# Patient Record
Sex: Female | Born: 1960 | Race: White | Hispanic: No | Marital: Married
Health system: Southern US, Community
[De-identification: ages and names within clinical notes are randomized; demographics above are authoritative.]

---

## 1999-06-06 ENCOUNTER — Other Ambulatory Visit: Admission: RE | Admit: 1999-06-06 | Discharge: 1999-06-06 | Payer: Self-pay | Admitting: Family Medicine

## 2000-10-22 ENCOUNTER — Other Ambulatory Visit: Admission: RE | Admit: 2000-10-22 | Discharge: 2000-10-22 | Payer: Self-pay | Admitting: Family Medicine

## 2001-11-20 ENCOUNTER — Other Ambulatory Visit: Admission: RE | Admit: 2001-11-20 | Discharge: 2001-11-20 | Payer: Self-pay | Admitting: Family Medicine

## 2003-02-02 ENCOUNTER — Other Ambulatory Visit: Admission: RE | Admit: 2003-02-02 | Discharge: 2003-02-02 | Payer: Self-pay | Admitting: Family Medicine

## 2003-08-31 ENCOUNTER — Other Ambulatory Visit: Admission: RE | Admit: 2003-08-31 | Discharge: 2003-08-31 | Payer: Self-pay | Admitting: Family Medicine

## 2004-04-04 ENCOUNTER — Other Ambulatory Visit: Admission: RE | Admit: 2004-04-04 | Discharge: 2004-04-04 | Payer: Self-pay | Admitting: Family Medicine

## 2004-09-12 ENCOUNTER — Ambulatory Visit: Payer: Self-pay | Admitting: Family Medicine

## 2004-09-15 ENCOUNTER — Other Ambulatory Visit: Admission: RE | Admit: 2004-09-15 | Discharge: 2004-09-15 | Payer: Self-pay | Admitting: Family Medicine

## 2004-09-15 ENCOUNTER — Ambulatory Visit: Payer: Self-pay | Admitting: Family Medicine

## 2004-12-06 ENCOUNTER — Ambulatory Visit: Payer: Self-pay | Admitting: Family Medicine

## 2005-01-03 ENCOUNTER — Ambulatory Visit: Payer: Self-pay | Admitting: Family Medicine

## 2005-02-05 ENCOUNTER — Ambulatory Visit: Payer: Self-pay | Admitting: Family Medicine

## 2005-03-14 ENCOUNTER — Ambulatory Visit: Payer: Self-pay | Admitting: Family Medicine

## 2005-04-12 ENCOUNTER — Ambulatory Visit: Payer: Self-pay | Admitting: Family Medicine

## 2005-06-13 ENCOUNTER — Ambulatory Visit: Payer: Self-pay | Admitting: Family Medicine

## 2005-06-21 ENCOUNTER — Ambulatory Visit: Payer: Self-pay | Admitting: Family Medicine

## 2005-11-21 ENCOUNTER — Ambulatory Visit: Payer: Self-pay | Admitting: Family Medicine

## 2005-11-21 ENCOUNTER — Other Ambulatory Visit: Admission: RE | Admit: 2005-11-21 | Discharge: 2005-11-21 | Payer: Self-pay | Admitting: Family Medicine

## 2005-11-26 ENCOUNTER — Ambulatory Visit: Payer: Self-pay | Admitting: Family Medicine

## 2005-12-27 ENCOUNTER — Ambulatory Visit: Payer: Self-pay | Admitting: Family Medicine

## 2007-06-30 ENCOUNTER — Ambulatory Visit: Payer: Self-pay | Admitting: Vascular Surgery

## 2010-03-27 ENCOUNTER — Ambulatory Visit (HOSPITAL_COMMUNITY): Admission: RE | Admit: 2010-03-27 | Discharge: 2010-03-27 | Payer: Self-pay | Admitting: Specialist

## 2010-04-13 ENCOUNTER — Ambulatory Visit (HOSPITAL_COMMUNITY): Admission: RE | Admit: 2010-04-13 | Discharge: 2010-04-14 | Payer: Self-pay | Admitting: Specialist

## 2011-01-22 LAB — CBC
HCT: 39.3 % (ref 36.0–46.0)
Hemoglobin: 13.2 g/dL (ref 12.0–15.0)
MCHC: 33.7 g/dL (ref 30.0–36.0)
MCV: 88.5 fL (ref 78.0–100.0)
Platelets: 210 10*3/uL (ref 150–400)
RBC: 4.44 MIL/uL (ref 3.87–5.11)
RDW: 14.3 % (ref 11.5–15.5)
WBC: 6.3 10*3/uL (ref 4.0–10.5)

## 2011-01-22 LAB — COMPREHENSIVE METABOLIC PANEL
ALT: 40 U/L — ABNORMAL HIGH (ref 0–35)
AST: 30 U/L (ref 0–37)
Albumin: 4.3 g/dL (ref 3.5–5.2)
Alkaline Phosphatase: 63 U/L (ref 39–117)
BUN: 9 mg/dL (ref 6–23)
CO2: 28 mEq/L (ref 19–32)
Calcium: 10.5 mg/dL (ref 8.4–10.5)
Chloride: 103 mEq/L (ref 96–112)
Creatinine, Ser: 0.81 mg/dL (ref 0.4–1.2)
GFR calc Af Amer: >60 mL/min (ref >60)
GFR calc non Af Amer: >60 mL/min (ref >60)
Glucose, Bld: 94 mg/dL (ref 70–99)
Potassium: 3.7 mEq/L (ref 3.5–5.1)
Sodium: 137 mEq/L (ref 135–145)
Total Bilirubin: 1.2 mg/dL (ref 0.3–1.2)
Total Protein: 7.5 g/dL (ref 6.0–8.3)

## 2011-01-22 LAB — SURGICAL PCR SCREEN: MRSA, PCR: NEGATIVE

## 2011-01-22 LAB — URINALYSIS, ROUTINE W REFLEX MICROSCOPIC
Glucose, UA: NEGATIVE mg/dL
Hgb urine dipstick: NEGATIVE
Nitrite: NEGATIVE
Specific Gravity, Urine: 1.007 (ref 1.005–1.030)

## 2011-01-22 LAB — PROTIME-INR: Prothrombin Time: 13.2 seconds (ref 11.6–15.2)

## 2011-01-22 LAB — APTT: aPTT: 32 seconds (ref 24–37)

## 2011-03-20 NOTE — Procedures (Signed)
LOWER EXTREMITY VENOUS REFLUX EXAM   INDICATION:  Bilateral lower extremity varicose veins.   EXAM:  Using color-flow imaging and pulse Doppler spectral analysis, the  right and left common femoral, superficial femoral, popliteal, posterior  tibial, greater and lesser saphenous veins are evaluated.  There is no  evidence suggesting deep venous insufficiency in the right and left  lower extremities.   The right and left saphenofemoral junction are competent.  The right and  left greater saphenous veins are competent/   The right and left proximal short saphenous vein demonstrates  competency.   GSV Diameter (used if found to be incompetent only)                                            Right    Left  Proximal Greater Saphenous Vein           cm       cm  Proximal-to-mid-thigh                     cm       cm  Mid thigh                                 cm       cm  Mid-distal thigh                          cm       cm  Distal thigh                              cm       cm  Knee                                      cm       cm   IMPRESSION:  1. The right and left greater saphenous veins are not aneurysmal.  2. The right and left greater saphenous veins are not tortuous.  3. The deep venous system is competent.  4. The right and left lesser saphenous veins are competent.  5. No evidence of deep venous thrombosis bilaterally.  6. No evidence of reflux bilaterally, however, numerous branches noted      in the right greater saphenous vein.   ___________________________________________  Quita Skye. Hart Rochester, M.D.   AS/MEDQ  D:  06/30/2007  T:  07/01/2007  Job:  [id number]

## 2011-03-20 NOTE — Consult Note (Signed)
VASCULAR SURGERY CONSULTATION   Brenda Good, Brenda Good  DOB:  11-30-1960                                       06/30/2007  VOZDG#:[id number]   The patient was referred by Dr. Sol Passer in [place] for painful  varicosities in both lower extremities.  This 50 year old female has  noticed more prominent varicosities in the posterior aspect of both legs  over the last few years which have become increasingly symptomatic.  She  describes a very tired and achy feeling in both calves posteriorly,  particularly when she is on her feet either standing for long periods of  time or sitting.  She also notices swelling toward the end of the day in  both ankles.  She has had no history of bleeding ulceration or deep  venous thrombosis but did have some thrombophlebitis in the right thigh  3 weeks ago treated with analgesics and anti-inflammatories.  She wore  elastic compression stockings (short leg) several years ago with no  improvement in her symptoms and she does elevate her legs frequently.  She is unable to take IBUPROFEN OR CODEINE for pain because of an  allergy.   PAST MEDICAL HISTORY:  Significant for fibromyalgia and hypertension.  She has no history of diabetes, coronary artery disease, hyperlipidemia,  congestive heart failure, or stroke.   PREVIOUS SURGERY:  Includes removal of a left breast cyst, left knee  cartilage surgery, removal of a right foot bunion, exploratory  laparotomy for adhesions, bilateral carpal tunnel repairs, and a  cholecystectomy.   FAMILY HISTORY:  Is positive for vascular disease in her mother,  negative for coronary artery disease, diabetes, and stroke.   SOCIAL HISTORY:  She is single.  Has 2 children and does not use tobacco  or alcohol.   REVIEW OF SYSTEMS AND MEDICATIONS:  Please see health history forms.   ALLERGIES:  IBUPROFEN AND CODEINE which cause nausea, rash, and soreness  and swelling in her mouth.   PHYSICAL EXAMINATION:  Blood  pressure is 142/80, heart rate 68 and  regular, respirations 16. Generally, she is a healthy-appearing middle-  aged female in no apparent distress.  She is alert and oriented times 3.  Her neck is supple with 3+ carotid pulses palpable.  No bruits are  audible.  Her neurologic exam is normal.  There is no palpable  adenopathy in the neck.  Chest is clear to auscultation.  Cardiovascular  Exam:  Reveals a regular rhythm with no murmurs.  Her abdomen is soft,  nontender, with no masses.  Extremity Exam:  Reveals 3+ femoral,  popliteal, and dorsalis pedis pulses palpable bilaterally.  Both feet  are well perfused with no evidence of hyperpigmentation, ulceration, or  significant edema.  She does have small varicosities in the posterior  aspect of both calves beginning in the popliteal fossa and extending  down the posterior calves.  These become much more prominent as she  stands longer periods of time.  She also has spider veins in the thighs  and proximal calves bilaterally and down near the ankle regions medially  and laterally.  There is also a prominent varix anterior to the right  patella.   Venous duplex exam was performed in the office today which revealed no  evidence of reflex at the saphenofemoral junction and no valvular  incompetence in either greater saphenous vein or  short saphenous vein.   She does not have evidence of valvular incompetence so laser ablation is  not indicated.  I think she is an excellent candidate for sclerotherapy  of these varicosities posteriorly and either skin laser or sclerotherapy  treatments for the spider veins.  She will consider this and be back in  touch with [place] if she would like to proceed for [place] to treat these painful  varicosities.   Quita Skye Hart Rochester, M.D.  Electronically Signed  JDL/MEDQ  D:  06/30/2007  T:  07/01/2007  Job:  [id number]

## 2016-04-10 ENCOUNTER — Ambulatory Visit (HOSPITAL_COMMUNITY)
Admission: RE | Admit: 2016-04-10 | Discharge: 2016-04-10 | Disposition: A | Discharge status: Home / Self Care | Payer: Worker's Compensation | Source: Ambulatory Visit | Attending: Specialist | Admitting: Specialist

## 2016-04-10 ENCOUNTER — Other Ambulatory Visit (HOSPITAL_COMMUNITY): Payer: Self-pay | Admitting: Specialist

## 2016-04-10 DIAGNOSIS — M545 Low back pain: Secondary | ICD-10-CM

## 2016-04-10 DIAGNOSIS — Z01818 Encounter for other preprocedural examination: Secondary | ICD-10-CM | POA: Insufficient documentation

## 2016-06-21 DIAGNOSIS — I10 Essential (primary) hypertension: Secondary | ICD-10-CM | POA: Insufficient documentation

## 2016-09-11 DIAGNOSIS — K219 Gastro-esophageal reflux disease without esophagitis: Secondary | ICD-10-CM | POA: Insufficient documentation

## 2016-09-11 DIAGNOSIS — J309 Allergic rhinitis, unspecified: Secondary | ICD-10-CM | POA: Insufficient documentation

## 2017-01-22 DIAGNOSIS — M5481 Occipital neuralgia: Secondary | ICD-10-CM | POA: Insufficient documentation

## 2017-03-08 DIAGNOSIS — G5600 Carpal tunnel syndrome, unspecified upper limb: Secondary | ICD-10-CM | POA: Insufficient documentation

## 2017-03-08 DIAGNOSIS — E559 Vitamin D deficiency, unspecified: Secondary | ICD-10-CM | POA: Insufficient documentation

## 2017-09-15 DIAGNOSIS — M8580 Other specified disorders of bone density and structure, unspecified site: Secondary | ICD-10-CM | POA: Insufficient documentation

## 2017-09-15 DIAGNOSIS — E782 Mixed hyperlipidemia: Secondary | ICD-10-CM | POA: Insufficient documentation

## 2017-12-03 DIAGNOSIS — M47816 Spondylosis without myelopathy or radiculopathy, lumbar region: Secondary | ICD-10-CM | POA: Insufficient documentation

## 2021-01-03 ENCOUNTER — Ambulatory Visit: Payer: BC Managed Care – PPO | Admitting: Podiatry

## 2021-01-03 ENCOUNTER — Ambulatory Visit (INDEPENDENT_AMBULATORY_CARE_PROVIDER_SITE_OTHER): Payer: BC Managed Care – PPO

## 2021-01-03 ENCOUNTER — Other Ambulatory Visit: Payer: Self-pay

## 2021-01-03 DIAGNOSIS — M89771 Major osseous defect, right ankle and foot: Secondary | ICD-10-CM

## 2021-01-03 DIAGNOSIS — M21612 Bunion of left foot: Secondary | ICD-10-CM

## 2021-01-03 DIAGNOSIS — M2042 Other hammer toe(s) (acquired), left foot: Secondary | ICD-10-CM

## 2021-01-03 DIAGNOSIS — S93144A Subluxation of metatarsophalangeal joint of right lesser toe(s), initial encounter: Secondary | ICD-10-CM

## 2021-01-03 DIAGNOSIS — M2041 Other hammer toe(s) (acquired), right foot: Secondary | ICD-10-CM

## 2021-01-03 DIAGNOSIS — M2011 Hallux valgus (acquired), right foot: Secondary | ICD-10-CM

## 2021-01-03 DIAGNOSIS — M21611 Bunion of right foot: Secondary | ICD-10-CM

## 2021-01-03 DIAGNOSIS — M2012 Hallux valgus (acquired), left foot: Secondary | ICD-10-CM

## 2021-01-03 DIAGNOSIS — L603 Nail dystrophy: Secondary | ICD-10-CM

## 2021-01-03 DIAGNOSIS — G5761 Lesion of plantar nerve, right lower limb: Secondary | ICD-10-CM

## 2021-01-04 ENCOUNTER — Encounter: Payer: Self-pay | Admitting: Podiatry

## 2021-01-04 NOTE — Progress Notes (Signed)
Subjective:  Patient ID: Brenda Good, female    DOB: Mar 23, 1961,  MRN: [id number]  Chief Complaint  Patient presents with  . Hammer Toe    Bilateral hammer toe/ bunions. PT stated that she has been having pain in her right foot for about 2 months now and the pain feels like a burning sensation.    60 y.o. female presents with the above complaint. History confirmed with patient.  She previously had right foot bunion surgery in the late 90s, it has come back.  She has bunions on both sides and it is crossing under the second toe which is overlapping.  She has pain also under the lesser toes worse on the right side.  She does have a history of lumbar spine issues with surgical intervention.  She also states that her toenails bother her and her mother had very severe toenail issues when she became older and she does not want the same for her.  She would like all her toenails to be removed permanently.  Objective:  Physical Exam: warm, good capillary refill, no trophic changes or ulcerative lesions, normal DP and PT pulses and normal sensory exam.  Bilaterally she does have severe hallux valgus with crossover toe deformity of the second toe over the hallux. Range of motion of the first MTPJ.  There is hypermobility in sagittal plane of the first ray.  Second toe is reducible, there is pain on the plantar portion of the joint with positive dorsal drawer test.  On the right foot she also has pain on palpation to the digital sulcus at the second and third interspace which produces neuritic symptoms into the toes.   Radiographs: X-ray of both feet: Previous bunion correction with pin fixation distal osteotomy on the right foot, she has severe hallux valgus with crossover toe deformity and partial subluxation of the second MTPJ Assessment:   1. Hammer toes of both feet   2. Hallux valgus with bunions, left   3. Hallux valgus with bunions, right   4. Hammertoe of left foot   5. Hammertoe of right  foot   6. Subluxation of metatarsophalangeal joint of lesser toe of right foot, initial encounter   7. Nail dystrophy   8. Major osseous defect, right ankle and foot      Plan:  Patient was evaluated and treated and all questions answered.  Discussed the etiology and treatment including surgical and non surgical treatment for painful bunions and hammertoes. She has exhausted all non surgical treatment prior to this visit including shoe gear changes and padding. She desires surgical intervention. We discussed all risks including but not limited to: pain, swelling, infection, scar, numbness which may be temporary or permanent, chronic pain, stiffness, nerve pain or damage, wound healing problems, bone healing problems including delayed or non-union and recurrence.   Specifically we discussed the following procedures: Lapidus bunionectomy with Akin osteotomy of the first ray and use of bone graft from the ipsilateral calcaneus, second hammertoe repair, plantar plate repair and Weil osteotomy, permanent total nail avulsion surgical matricectomy of all digits bilaterally.  We will perform the right foot first than the left.  Reviewed the postoperative course including the need for prolonged nonweightbearing up to 4 weeks, partial weightbearing in a CAM boot for 4 weeks and then weightbearing issue for 4 weeks.  She does have some neuritic symptoms which are consistent with a neuroma.  This could be coming from her lumbar spine as well.  I would like to order  an MRI to evaluate if this is in fact a neuroma, if there is one we will consider excision versus injection therapy at the time of surgery.   I also counseled her that in postmenopausal females it is prudent to evaluate her vitamin D and calcium levels, orders were given for this to be checked in the event we need to supplement this postoperatively for bone healing.   Informed consent was signed today. Surgery will be scheduled at a mutually agreeable  date. Information regarding this will be forwarded to our surgery scheduler.   Surgical plan:  Procedure: -Right foot Lapidus bunionectomy with Akin osteotomy of the first ray and use of bone graft from the ipsilateral calcaneus, second hammertoe repair, plantar plate repair and Weil osteotomy, permanent total nail avulsion surgical matricectomy of all digits  Location: -[facility]  Anesthesia plan: -General anesthesia with regional block  Postoperative pain plan: - Tylenol 1000 mg every 6 hours, ibuprofen 600 mg every 6 hours, gabapentin 300 mg every 8 hours x5 days, oxycodone 5 mg 1-2 tabs every 6 hours only as needed  DVT prophylaxis: -ASA 325 mg twice daily  WB Restrictions / DME needs: -We will place in posterior splint at the time of surgery and then transition to CAM boot postoperatively   No follow-ups on file.

## 2021-01-17 NOTE — Addendum Note (Signed)
Addended byLilian Kapur, ADAM R on: 01/17/2021 11:55 AM   Modules accepted: Orders

## 2021-01-18 ENCOUNTER — Telehealth: Payer: Self-pay | Admitting: Urology

## 2021-01-18 NOTE — Telephone Encounter (Signed)
DOS: 02/03/21  AIKEN OSTEOTOMY RIGHT -- [phone] LAPIDUS PROCEDURE INCLUDING BUNIONECTOMY RIGHT --- 28297 METATARSAL OSTEOTOMY 2ND RIGHT --- 21117 EXC NAIL PERM 1-5 RIGHT --- 11750 HAMMERTOE REPAIR 2ND RIGHT --- 35670 RECONSTRUCTION ANGULAR DEFORMITY RIGHT --- 14103 BONE GRAFT RIGHT --- 20900   BCBS EFFECTIVE DATE - 10/05/20  PLAN DEDUCTIBLE - $5,000.00 W/ $4,020.48 REMAINING   OUT OF POCKET - $8,550.00 W/ $7,251.56 REMAINING  COPAY - $0.00 COINSURANCE - 30% PER SERVICE YEAR  PER BCBS WEB SITE NO PRIOR AUTH IS REQUIRED FOR CPT CODES 01314, [id number], [id number], 11750, 38887, 57972 AND 20900.

## 2021-01-19 ENCOUNTER — Other Ambulatory Visit: Payer: Self-pay | Admitting: Podiatry

## 2021-01-19 DIAGNOSIS — G5761 Lesion of plantar nerve, right lower limb: Secondary | ICD-10-CM

## 2021-01-30 ENCOUNTER — Telehealth: Payer: Self-pay

## 2021-01-30 DIAGNOSIS — M79676 Pain in unspecified toe(s): Secondary | ICD-10-CM

## 2021-01-30 DIAGNOSIS — R2689 Other abnormalities of gait and mobility: Secondary | ICD-10-CM

## 2021-01-30 NOTE — Telephone Encounter (Signed)
Order for a knee scooter has been placed with [employer] and [facility] has been notified.

## 2021-01-30 NOTE — Addendum Note (Signed)
Addended byLilian Kapur, Shalaine Payson R on: 01/30/2021 03:44 PM   Modules accepted: Orders

## 2021-01-30 NOTE — Telephone Encounter (Signed)
Brenda Good is having surgery on 02/03/2021 and wanted to know if she needs a knee scooter. If so, will you please order in Epic and then I can place the order with [employer].

## 2021-01-31 ENCOUNTER — Ambulatory Visit
Admission: RE | Admit: 2021-01-31 | Discharge: 2021-01-31 | Disposition: A | Discharge status: Home / Self Care | Payer: BC Managed Care – PPO | Source: Ambulatory Visit | Attending: Podiatry | Admitting: Podiatry

## 2021-01-31 DIAGNOSIS — G5761 Lesion of plantar nerve, right lower limb: Secondary | ICD-10-CM

## 2021-02-03 ENCOUNTER — Other Ambulatory Visit: Payer: Self-pay | Admitting: Podiatry

## 2021-02-03 DIAGNOSIS — M2041 Other hammer toe(s) (acquired), right foot: Secondary | ICD-10-CM

## 2021-02-03 DIAGNOSIS — M2011 Hallux valgus (acquired), right foot: Secondary | ICD-10-CM

## 2021-02-03 DIAGNOSIS — M216X1 Other acquired deformities of right foot: Secondary | ICD-10-CM

## 2021-02-03 DIAGNOSIS — Z4889 Encounter for other specified surgical aftercare: Secondary | ICD-10-CM

## 2021-02-03 DIAGNOSIS — M205X1 Other deformities of toe(s) (acquired), right foot: Secondary | ICD-10-CM

## 2021-02-03 MED ORDER — ACETAMINOPHEN 500 MG PO TABS: 1000.0000 mg | ORAL_TABLET | Freq: Four times a day (QID) | ORAL | 0 refills | Status: AC | PRN

## 2021-02-03 MED ORDER — OXYCODONE HCL 5 MG PO TABS: 5.0000 mg | ORAL_TABLET | ORAL | 0 refills | Status: AC | PRN

## 2021-02-03 MED ORDER — AMOXICILLIN-POT CLAVULANATE 875-125 MG PO TABS: 1.0000 | ORAL_TABLET | Freq: Two times a day (BID) | ORAL | 0 refills | Status: DC

## 2021-02-03 MED ORDER — ASPIRIN EC 325 MG PO TBEC: 325.0000 mg | DELAYED_RELEASE_TABLET | Freq: Two times a day (BID) | ORAL | 0 refills | Status: DC

## 2021-02-03 MED ORDER — NAPROXEN 500 MG PO TABS: 500.0000 mg | ORAL_TABLET | Freq: Two times a day (BID) | ORAL | 0 refills | Status: DC

## 2021-02-03 NOTE — Progress Notes (Signed)
02/03/21 revision bunion correction with Lapidus, bone graft from calcaneus, Weil 2nd, hammertoe correction 2nd, permanent total nail matrixectomy x5 right foot

## 2021-02-06 ENCOUNTER — Telehealth: Payer: Self-pay | Admitting: Podiatry

## 2021-02-06 NOTE — Telephone Encounter (Signed)
Patient called our office today and states she had surgery 02/03/21 and she has fell since then and she is concerned.

## 2021-02-06 NOTE — Telephone Encounter (Signed)
Spoke with the patient, she fell but was in boot, no increased pain. Will see her on Thursday as scheduled

## 2021-02-09 ENCOUNTER — Ambulatory Visit (INDEPENDENT_AMBULATORY_CARE_PROVIDER_SITE_OTHER): Payer: BC Managed Care – PPO | Admitting: Podiatry

## 2021-02-09 ENCOUNTER — Other Ambulatory Visit: Payer: Self-pay

## 2021-02-09 ENCOUNTER — Ambulatory Visit (INDEPENDENT_AMBULATORY_CARE_PROVIDER_SITE_OTHER): Payer: BC Managed Care – PPO

## 2021-02-09 ENCOUNTER — Encounter: Payer: Self-pay | Admitting: Podiatry

## 2021-02-09 DIAGNOSIS — M2041 Other hammer toe(s) (acquired), right foot: Secondary | ICD-10-CM

## 2021-02-09 DIAGNOSIS — M2011 Hallux valgus (acquired), right foot: Secondary | ICD-10-CM

## 2021-02-09 DIAGNOSIS — M21611 Bunion of right foot: Secondary | ICD-10-CM | POA: Diagnosis not present

## 2021-02-09 DIAGNOSIS — S93144A Subluxation of metatarsophalangeal joint of right lesser toe(s), initial encounter: Secondary | ICD-10-CM

## 2021-02-09 DIAGNOSIS — M2042 Other hammer toe(s) (acquired), left foot: Secondary | ICD-10-CM

## 2021-02-09 DIAGNOSIS — T8484XA Pain due to internal orthopedic prosthetic devices, implants and grafts, initial encounter: Secondary | ICD-10-CM

## 2021-02-09 DIAGNOSIS — M21961 Unspecified acquired deformity of right lower leg: Secondary | ICD-10-CM

## 2021-02-09 NOTE — Progress Notes (Signed)
  Subjective:  Patient ID: Brenda Good, female    DOB: 07/06/1961,  MRN: [id number]  Chief Complaint  Patient presents with  . Routine Post Op    PT stated that she is doing well she does have some numbness     DOS: 02/03/2021 Procedure: Right foot bunionectomy Lapidus and Akin, second metatarsal Weil osteotomy and hammertoe repair with bone graft from right heel  60 y.o. female returns for post-op check.  She is doing well.  No issues with breathing or pneumonia symptoms.  Has been icing and elevating  Review of Systems: Negative except as noted in the HPI. Denies N/V/F/Ch.   Objective:  There were no vitals filed for this visit. There is no height or weight on file to calculate BMI. Constitutional Well developed. Well nourished.  Vascular Foot warm and well perfused. Capillary refill normal to all digits.   Neurologic Normal speech. Oriented to person, place, and time. Epicritic sensation to light touch grossly present bilaterally.  Dermatologic Skin healing well without signs of infection. Skin edges well coapted without signs of infection.  Orthopedic: Tenderness to palpation noted about the surgical site.  Correction is maintained   Radiographs: Status post Lapidus Akin, second metatarsal osteotomy and hammertoe correction, bone graft from heel Assessment:   1. Hallux valgus with bunions, right   2. Hammer toes of both feet   3. Subluxation of metatarsophalangeal joint of lesser toe of right foot, initial encounter   4. Metatarsal deformity, right   5. Pain due to internal orthopedic prosthetic devices, implants and grafts, initial encounter [facility])    Plan:  Patient was evaluated and treated and all questions answered.  S/p foot surgery right -Progressing as expected post-operatively. -XR: As above -WB Status: NWB in CAM boot -Sutures: We will likely leave for 2 more weeks. -Medications: No refills required -Foot redressed.  No follow-ups on file.

## 2021-02-16 ENCOUNTER — Other Ambulatory Visit: Payer: Self-pay

## 2021-02-16 ENCOUNTER — Encounter: Payer: BC Managed Care – PPO | Admitting: Podiatry

## 2021-02-16 ENCOUNTER — Telehealth: Payer: Self-pay | Admitting: Podiatry

## 2021-02-16 ENCOUNTER — Ambulatory Visit (INDEPENDENT_AMBULATORY_CARE_PROVIDER_SITE_OTHER): Payer: BC Managed Care – PPO | Admitting: Podiatry

## 2021-02-16 DIAGNOSIS — M21961 Unspecified acquired deformity of right lower leg: Secondary | ICD-10-CM

## 2021-02-16 DIAGNOSIS — M2011 Hallux valgus (acquired), right foot: Secondary | ICD-10-CM

## 2021-02-16 DIAGNOSIS — M2041 Other hammer toe(s) (acquired), right foot: Secondary | ICD-10-CM

## 2021-02-16 DIAGNOSIS — S93144A Subluxation of metatarsophalangeal joint of right lesser toe(s), initial encounter: Secondary | ICD-10-CM

## 2021-02-16 DIAGNOSIS — M2042 Other hammer toe(s) (acquired), left foot: Secondary | ICD-10-CM

## 2021-02-16 DIAGNOSIS — M21611 Bunion of right foot: Secondary | ICD-10-CM

## 2021-02-16 NOTE — Telephone Encounter (Signed)
Patient on the line stating she needs to be. Surgical area has turned black. The patient was actually scheduled for today at 845 but the patient no showed. I informed patient and she stated someone called her and reschedule but nothing reflects in the chart to what the patient is saying, Please Advise

## 2021-02-16 NOTE — Telephone Encounter (Signed)
Patient called our office today stating she had surgery on her foot 02/03/21. She states that she has a pin in one of her toes and it is turning purple , swelling and redness and she is concern.

## 2021-02-16 NOTE — Telephone Encounter (Signed)
Patient has requested return call, Please Advise 

## 2021-02-21 NOTE — Progress Notes (Signed)
  Subjective:  Patient ID: Brenda Good, female    DOB: 10/24/1961,  MRN: [id number]  Chief Complaint  Patient presents with  . Post-op Problem     POV#2 DOS 02/03/21 CORRECTION OF BUNION(LAPIDUS AND Quintella Reichert) 2ND HAMMER TOE, REPAIR OF PLANTAR PLATE 2ND TOE, WEIL OSTEOTOMY 2ND RT, BONE GRAFT FROM HEEL, REMOVAL OF ALL TOE NAILS PERM RT    DOS: 02/03/2021 Procedure: Right foot bunionectomy Lapidus and Akin, second metatarsal Weil osteotomy and hammertoe repair with bone graft from right heel  60 y.o. female returns for post-op check.  She is doing well.  Returns today because they were concerned about the incision  Review of Systems: Negative except as noted in the HPI. Denies N/V/F/Ch.   Objective:  There were no vitals filed for this visit. There is no height or weight on file to calculate BMI. Constitutional Well developed. Well nourished.  Vascular Foot warm and well perfused. Capillary refill normal to all digits.   Neurologic Normal speech. Oriented to person, place, and time. Epicritic sensation to light touch grossly present bilaterally.  Dermatologic Skin healing well without signs of infection. Skin edges well coapted without signs of infection.  Orthopedic: Tenderness to palpation noted about the surgical site.  Correction is maintained   Radiographs: Status post Lapidus Akin, second metatarsal osteotomy and hammertoe correction, bone graft from heel Assessment:   1. Hallux valgus with bunions, right   2. Hammer toes of both feet   3. Subluxation of metatarsophalangeal joint of lesser toe of right foot, initial encounter   4. Metatarsal deformity, right    Plan:  Patient was evaluated and treated and all questions answered.  S/p foot surgery right -Progressing as expected post-operatively. -XR: As above -WB Status: NWB in CAM boot -Sutures: Removed next week -Medications: No refills required -Foot redressed.  Return in about 1 week (around 02/23/2021) for post op  visit .

## 2021-02-23 ENCOUNTER — Ambulatory Visit (INDEPENDENT_AMBULATORY_CARE_PROVIDER_SITE_OTHER): Payer: BC Managed Care – PPO | Admitting: Podiatry

## 2021-02-23 ENCOUNTER — Other Ambulatory Visit: Payer: Self-pay

## 2021-02-23 DIAGNOSIS — M21611 Bunion of right foot: Secondary | ICD-10-CM

## 2021-02-23 DIAGNOSIS — M2011 Hallux valgus (acquired), right foot: Secondary | ICD-10-CM

## 2021-02-23 DIAGNOSIS — M2041 Other hammer toe(s) (acquired), right foot: Secondary | ICD-10-CM

## 2021-02-23 NOTE — Patient Instructions (Signed)
On 4/29 Friday you may start to put some weight on the heel in the boot. Use a crutch for support/balance

## 2021-02-25 ENCOUNTER — Other Ambulatory Visit: Payer: Self-pay | Admitting: Podiatry

## 2021-02-26 NOTE — Progress Notes (Signed)
  Subjective:  Patient ID: Brenda Good, female    DOB: [birth year],  MRN: [id number]  Chief Complaint  Patient presents with  . Routine Post Op    POV- DOS 02/03/21 CORRECTION OF BUNION(LAPIDUS AND Quintella Reichert) 2ND HAMMER TOE, REPAIR OF PLANTAR PLATE 2ND TOE, WEIL OSTEOTOMY 2ND RT, BONE GRAFT FROM HEEL, REMOVAL OF ALL TOE NAILS PERM RT    DOS: 02/03/2021 Procedure: Right foot bunionectomy Lapidus and Akin, second metatarsal Weil osteotomy and hammertoe repair with bone graft from right heel  60 y.o. female returns for post-op check.  She is doing well.    Review of Systems: Negative except as noted in the HPI. Denies N/V/F/Ch.   Objective:  There were no vitals filed for this visit. There is no height or weight on file to calculate BMI. Constitutional Well developed. Well nourished.  Vascular Foot warm and well perfused. Capillary refill normal to all digits.   Neurologic Normal speech. Oriented to person, place, and time. Epicritic sensation to light touch grossly present bilaterally.  Dermatologic Skin healing well without signs of infection. Skin edges well coapted without signs of infection.  Hemorrhagic bulla around second toe, no signs of pin infection  Orthopedic: Tenderness to palpation noted about the surgical site.  Correction is maintained   Radiographs: Status post Lapidus Akin, second metatarsal osteotomy and hammertoe correction, bone graft from heel Assessment:   No diagnosis found. Plan:  Patient was evaluated and treated and all questions answered.  S/p foot surgery right -Progressing as expected post-operatively. -XR: As above -WB Status: NWB in CAM boot -Sutures: All sutures removed -Medications: No refills required -Can resume bathing, Betadine to second toe if draining -Once pin is out and healed can plan for toenail avulsions  Return in about 2 weeks (around 03/09/2021) for take x-rays pull pin.

## 2021-03-02 ENCOUNTER — Encounter: Payer: BC Managed Care – PPO | Admitting: Podiatry

## 2021-03-09 ENCOUNTER — Ambulatory Visit (INDEPENDENT_AMBULATORY_CARE_PROVIDER_SITE_OTHER): Payer: BC Managed Care – PPO | Admitting: Podiatry

## 2021-03-09 ENCOUNTER — Other Ambulatory Visit: Payer: Self-pay

## 2021-03-09 ENCOUNTER — Ambulatory Visit (INDEPENDENT_AMBULATORY_CARE_PROVIDER_SITE_OTHER): Payer: BC Managed Care – PPO

## 2021-03-09 DIAGNOSIS — M2042 Other hammer toe(s) (acquired), left foot: Secondary | ICD-10-CM

## 2021-03-09 DIAGNOSIS — M2011 Hallux valgus (acquired), right foot: Secondary | ICD-10-CM

## 2021-03-09 DIAGNOSIS — M21611 Bunion of right foot: Secondary | ICD-10-CM

## 2021-03-09 DIAGNOSIS — M2041 Other hammer toe(s) (acquired), right foot: Secondary | ICD-10-CM

## 2021-03-14 ENCOUNTER — Encounter: Payer: Self-pay | Admitting: Podiatry

## 2021-03-14 NOTE — Progress Notes (Signed)
OK it'll be 11750 x5 for all toes on the left foot. I'll have her sign consent morning of.

## 2021-03-14 NOTE — Progress Notes (Signed)
  Subjective:  Patient ID: Brenda Good, female    DOB: 10-11-1961,  MRN: [id number]  Chief Complaint  Patient presents with  . Routine Post Op    (xray)2 wk fu NAILS PERM RT,take x-rays pull pin    DOS: 02/03/2021 Procedure: Right foot bunionectomy Lapidus and Akin, second metatarsal Weil osteotomy and hammertoe repair with bone graft from right heel  60 y.o. female returns for post-op check.  She is doing well.    Review of Systems: Negative except as noted in the HPI. Denies N/V/F/Ch.   Objective:  There were no vitals filed for this visit. There is no height or weight on file to calculate BMI. Constitutional Well developed. Well nourished.  Vascular Foot warm and well perfused. Capillary refill normal to all digits.   Neurologic Normal speech. Oriented to person, place, and time. Epicritic sensation to light touch grossly present bilaterally.  Dermatologic Skin healing well without signs of infection. Skin edges well coapted without signs of infection.    Orthopedic: Tenderness to palpation noted about the surgical site.  Correction is maintained   Radiographs: Early consolidation of arthrodesis sites  Assessment:   1. Hallux valgus with bunions, right    Plan:  Patient was evaluated and treated and all questions answered.  S/p foot surgery right -Progressing as expected post-operatively. -XR: As above -WB Status: May begin WB in CAM boot -Sutures: All sutures removed -Medications: No refills required -Pin removed uneventfully -At next visit we will plan to do an office surgery for toenail removals x5 on the left side-  Return in about 4 weeks (around 04/06/2021) for new x-rays, take off toenails .

## 2021-03-22 ENCOUNTER — Telehealth: Payer: Self-pay | Admitting: Urology

## 2021-03-22 NOTE — Telephone Encounter (Addendum)
OFFICE DOS - 05/09/21  EXC NAIL PERM 1-5 LEFT --- 11750   BCBS EFFECTIVE DATE - 10/05/20  PLAN DEDUCTIBLE - $5,000.00 W/ $0.00 REMAINING OUT OF POCKET - $8,550.00 W/ $0.00 REMAINING COINSURANCE - 30% COPAY - $0.00   NO PRIOR AUTH REQUIRED

## 2021-03-25 ENCOUNTER — Other Ambulatory Visit: Payer: Self-pay | Admitting: Podiatry

## 2021-04-06 ENCOUNTER — Ambulatory Visit: Payer: BC Managed Care – PPO | Admitting: Podiatry

## 2021-04-11 ENCOUNTER — Encounter: Payer: Self-pay | Admitting: Podiatry

## 2021-04-11 ENCOUNTER — Other Ambulatory Visit: Payer: Self-pay

## 2021-04-11 ENCOUNTER — Telehealth: Payer: Self-pay | Admitting: *Deleted

## 2021-04-11 ENCOUNTER — Ambulatory Visit (INDEPENDENT_AMBULATORY_CARE_PROVIDER_SITE_OTHER): Payer: BC Managed Care – PPO | Admitting: Podiatry

## 2021-04-11 ENCOUNTER — Ambulatory Visit (INDEPENDENT_AMBULATORY_CARE_PROVIDER_SITE_OTHER): Payer: BC Managed Care – PPO

## 2021-04-11 DIAGNOSIS — M21611 Bunion of right foot: Secondary | ICD-10-CM

## 2021-04-11 DIAGNOSIS — M2011 Hallux valgus (acquired), right foot: Secondary | ICD-10-CM | POA: Diagnosis not present

## 2021-04-11 DIAGNOSIS — M2041 Other hammer toe(s) (acquired), right foot: Secondary | ICD-10-CM

## 2021-04-11 DIAGNOSIS — L603 Nail dystrophy: Secondary | ICD-10-CM

## 2021-04-11 MED ORDER — NEOMYCIN-POLYMYXIN-HC 3.5-10000-1 OT SUSP: OTIC | 0 refills | Status: DC

## 2021-04-11 NOTE — Patient Instructions (Signed)

## 2021-04-11 NOTE — Progress Notes (Signed)
  Subjective:  Patient ID: Brenda Good, female    DOB: 03-Dec-1960,  MRN: [id number]  Chief Complaint  Patient presents with  . Routine Post Op    Right foot, with xray  . Nail Problem    Removal all 5 nails right foot    DOS: 02/03/2021 Procedure: Right foot bunionectomy Lapidus and Akin, second metatarsal Weil osteotomy and hammertoe repair with bone graft from right heel  60 y.o. female returns for post-op check and removal of all toenails permanently.  She is doing well.  Still has some swelling and discomfort but has been wearing the boot  Review of Systems: Negative except as noted in the HPI. Denies N/V/F/Ch.   Objective:  There were no vitals filed for this visit. There is no height or weight on file to calculate BMI. Constitutional Well developed. Well nourished.  Vascular Foot warm and well perfused. Capillary refill normal to all digits.   Neurologic Normal speech. Oriented to person, place, and time. Epicritic sensation to light touch grossly present bilaterally.  Dermatologic Skin healing well without signs of infection. Skin edges well coapted without signs of infection.   Orthopedic: Tenderness to palpation noted about the surgical site.  Moderate edema correction is maintained   Radiographs: Status post Lapidus Akin, second metatarsal osteotomy and hammertoe correction, bone graft from heel, the arthrodesis site is still visible and not completely healed, no complication of hardware no fracture or migration of screws or plates Assessment:   1. Hallux valgus with bunions, right   2. Hammertoe of right foot   3. Nail dystrophy    Plan:  Patient was evaluated and treated and all questions answered.  S/p foot surgery right -Progressing as expected post-operatively.  She does have some evidence of delayed healing of the arthrodesis site.  I think she can slowly begin to increase her weightbearing out of the boot gradually over the next 4 weeks.  If it is not  healed and she is still symptomatic at next visit we will plan for CT and possible bone marrow stimulation -XR: As above -WB Status: WBAT in CAM boot, begin to transition out of boot    -She presents for total permanent removal of all toenails on the right foot.  Informed consent was signed and reviewed.  All questions were addressed prior to the procedure. . Procedure: Excision of Toenail Location: Right  Toenails 1 through 5 Anesthesia: Lidocaine 2% plain 12 cc Skin Prep: Betadine. Dressing: Silvadene; telfa; dry, sterile, compression dressing. Technique: Following skin prep, the tourniquet was inflated around the ankle at 250 mmHg.  Each nail was avulsed with a Freer and hemostat. Chemical matrixectomy was then performed with phenol and irrigated out with alcohol. The tourniquet was then removed after a total of 11 minutes and sterile dressing applied. Disposition: Patient tolerated procedure well. Patient to return in 2 weeks for follow-up.    No follow-ups on file.

## 2021-04-11 NOTE — Telephone Encounter (Signed)
Patient is calling, thought that a pain medicine(Tramadol) was supposed to be sent to her pharmacy along with the ear drops. Please advise

## 2021-04-12 MED ORDER — TRAMADOL HCL 50 MG PO TABS: 50.0000 mg | ORAL_TABLET | Freq: Four times a day (QID) | ORAL | 0 refills | Status: DC | PRN

## 2021-04-12 NOTE — Telephone Encounter (Signed)
Tramadol prescription request

## 2021-04-20 ENCOUNTER — Other Ambulatory Visit: Payer: Self-pay

## 2021-04-20 ENCOUNTER — Encounter: Payer: Self-pay | Admitting: Podiatry

## 2021-04-20 ENCOUNTER — Ambulatory Visit (INDEPENDENT_AMBULATORY_CARE_PROVIDER_SITE_OTHER): Payer: BC Managed Care – PPO | Admitting: Podiatry

## 2021-04-20 DIAGNOSIS — L603 Nail dystrophy: Secondary | ICD-10-CM

## 2021-04-22 ENCOUNTER — Other Ambulatory Visit: Payer: Self-pay | Admitting: Podiatry

## 2021-04-24 ENCOUNTER — Encounter: Payer: Self-pay | Admitting: Podiatry

## 2021-04-24 NOTE — Progress Notes (Signed)
  Subjective:  Patient ID: Brenda Good, female    DOB: 04/28/1961,  MRN: [id number]  No chief complaint on file.   DOS: 04/11/2021 Procedure: For removal of toenails of right foot  60 y.o. female returns for post-op check from removal of all toenails permanently.  Overall doing well  Review of Systems: Negative except as noted in the HPI. Denies N/V/F/Ch.   Objective:  There were no vitals filed for this visit. There is no height or weight on file to calculate BMI. Constitutional Well developed. Well nourished.  Vascular Foot warm and well perfused. Capillary refill normal to all digits.   Neurologic Normal speech. Oriented to person, place, and time. Epicritic sensation to light touch grossly present bilaterally.  Dermatologic Matricectomy sites are healing well  Orthopedic: Still has edema on the right foot   Radiographs: Status post Lapidus Akin, second metatarsal osteotomy and hammertoe correction, bone graft from heel, the arthrodesis site is still visible and not completely healed, no complication of hardware no fracture or migration of screws or plates Assessment:   No diagnosis found.  Plan:  Patient was evaluated and treated and all questions answered.  S/p foot surgery right -Overall doing well she may discontinue the soaks and ointment at this point and leave open to air.  -She would like to plan the permanent removal of toenails on the left foot as well.  We will schedule this for next visit.  -At next visit also take x-rays on the right foot for follow-up from her most recent surgery  -We will plan for her left foot on September 9.  Sign consent and we will schedule her at next visit   Return in 19 days (on 05/09/2021).

## 2021-05-09 ENCOUNTER — Ambulatory Visit (INDEPENDENT_AMBULATORY_CARE_PROVIDER_SITE_OTHER): Payer: BC Managed Care – PPO

## 2021-05-09 ENCOUNTER — Ambulatory Visit (INDEPENDENT_AMBULATORY_CARE_PROVIDER_SITE_OTHER): Payer: BC Managed Care – PPO | Admitting: Podiatry

## 2021-05-09 ENCOUNTER — Encounter: Payer: Self-pay | Admitting: Podiatry

## 2021-05-09 ENCOUNTER — Other Ambulatory Visit: Payer: Self-pay | Admitting: Podiatry

## 2021-05-09 ENCOUNTER — Other Ambulatory Visit: Payer: Self-pay

## 2021-05-09 DIAGNOSIS — M21611 Bunion of right foot: Secondary | ICD-10-CM

## 2021-05-09 DIAGNOSIS — L603 Nail dystrophy: Secondary | ICD-10-CM

## 2021-05-09 DIAGNOSIS — M2011 Hallux valgus (acquired), right foot: Secondary | ICD-10-CM | POA: Diagnosis not present

## 2021-05-09 DIAGNOSIS — M21961 Unspecified acquired deformity of right lower leg: Secondary | ICD-10-CM

## 2021-05-09 DIAGNOSIS — M2012 Hallux valgus (acquired), left foot: Secondary | ICD-10-CM

## 2021-05-09 DIAGNOSIS — M960 Pseudarthrosis after fusion or arthrodesis: Secondary | ICD-10-CM

## 2021-05-09 DIAGNOSIS — M2041 Other hammer toe(s) (acquired), right foot: Secondary | ICD-10-CM

## 2021-05-09 DIAGNOSIS — M21612 Bunion of left foot: Secondary | ICD-10-CM | POA: Diagnosis not present

## 2021-05-09 DIAGNOSIS — M21962 Unspecified acquired deformity of left lower leg: Secondary | ICD-10-CM | POA: Diagnosis not present

## 2021-05-09 DIAGNOSIS — M2042 Other hammer toe(s) (acquired), left foot: Secondary | ICD-10-CM | POA: Diagnosis not present

## 2021-05-09 NOTE — Patient Instructions (Signed)

## 2021-05-09 NOTE — Progress Notes (Signed)
Subjective:  Patient ID: Brenda Good, female    DOB: 03/14/1961,  MRN: [id number]  Chief Complaint  Patient presents with   Nail Problem     OFFICE SURGERY--EXC NAIL PERM 1-5 LT/XRAYS RT FOOT/SIGN CONSENT FORMS FOR 07/14/2021 SX    DOS: 02/03/2021 Procedure: Right foot bunionectomy Lapidus and Akin, second metatarsal Weil osteotomy and hammertoe repair with bone graft from right heel  60 y.o. female returns for follow-up on right foot surgery, planning for left foot surgery and today left foot removal of all toenails permanently.  Toenails on the right side are healing well.  Foot is doing well on the right side does have some persistent swelling and soreness  Review of Systems: Negative except as noted in the HPI. Denies N/V/F/Ch.   Objective:  There were no vitals filed for this visit. There is no height or weight on file to calculate BMI. Constitutional Well developed. Well nourished.  Vascular Foot warm and well perfused. Capillary refill normal to all digits.   Neurologic Normal speech. Oriented to person, place, and time. Epicritic sensation to light touch grossly present bilaterally.  Dermatologic Skin healing well without signs of infection. Skin edges well coapted without signs of infection.   Orthopedic: On her right foot she has tenderness to palpation noted about the surgical site.  Moderate edema, correction is maintained  On the left foot she has hallux valgus deformity that is quite severe with rigid hammertoe deformity of the second and semireducible adductovarus hammertoes of the third and fourth toes.  Dystrophic toenails 1 through 5 left side   Radiographs: Status post Lapidus Akin, second metatarsal osteotomy and hammertoe correction, bone graft from heel, the arthrodesis site is still visible and not completely healed, no complication of hardware no fracture or migration of screws or plates, no loosening Assessment:   1. Hallux valgus with bunions, right   2.  Hallux valgus with bunions, left   3. Nail dystrophy   4. Pseudarthrosis after fusion or arthrodesis   5. Hammertoe of right foot   6. Metatarsal deformity, right   7. Metatarsal deformity, left   8. Hammertoe of left foot    Plan:  Patient was evaluated and treated and all questions answered.  S/p foot surgery right -Still has some radiographic lucency on her right foot arthrodesis site.  I am ordering a CT scan to evaluate this and we will begin bone marrow stimulation if she qualifies.   Hallux valgus and hammertoes left foot She like to proceed with planning for her left foot surgery on September 9.  We discussed the risk, benefits and potential complications including but not limited to pain, swelling, infection, scar, numbness which may be temporary or permanent, chronic pain, stiffness, nerve pain or damage, wound healing problems, bone healing problems including delayed or non-union.  She understands and wished to proceed.  All questions were addressed.  Informed consent was signed and reviewed.  Surgical plan will be as follows Lapidus bunionectomy with possible Akin the left foot with autograft from left heel, second hammertoe correction with possible metatarsal osteotomy of the second metatarsal head, flexor tenotomy's of the third and fourth toes.  She will be NWB in a short CAM boot which was dispensed today.  DVT prophylaxis plan twice daily 325 mg aspirin.    -She presents for total permanent removal of all toenails on the left foot.  Informed consent was signed and reviewed.  All questions were addressed prior to the procedure. . Procedure: Excision  of Toenail Location: Right  Toenails 1 through 5 Anesthesia: Lidocaine 2% plain 12 cc Skin Prep: Betadine. Dressing: Silvadene; telfa; dry, sterile, compression dressing. Technique: Following skin prep, the tourniquet was inflated around the ankle at 250 mmHg.  Each nail was avulsed with a Freer and hemostat. Chemical  matrixectomy was then performed with phenol and irrigated out with alcohol. The tourniquet was then removed after a total of 15 minutes and sterile dressing applied. Disposition: Patient tolerated procedure well. Patient to return in 2 weeks for follow-up.    Return in about 2 weeks (around 05/23/2021) for right nail check.

## 2021-05-12 ENCOUNTER — Other Ambulatory Visit: Payer: Self-pay | Admitting: Podiatry

## 2021-05-23 ENCOUNTER — Other Ambulatory Visit: Payer: Self-pay

## 2021-05-23 ENCOUNTER — Ambulatory Visit: Payer: BC Managed Care – PPO | Admitting: Podiatry

## 2021-05-23 ENCOUNTER — Ambulatory Visit (INDEPENDENT_AMBULATORY_CARE_PROVIDER_SITE_OTHER): Payer: BC Managed Care – PPO | Admitting: Podiatry

## 2021-05-23 DIAGNOSIS — M2011 Hallux valgus (acquired), right foot: Secondary | ICD-10-CM

## 2021-05-23 DIAGNOSIS — M21612 Bunion of left foot: Secondary | ICD-10-CM

## 2021-05-23 DIAGNOSIS — M2012 Hallux valgus (acquired), left foot: Secondary | ICD-10-CM

## 2021-05-23 DIAGNOSIS — M21611 Bunion of right foot: Secondary | ICD-10-CM | POA: Diagnosis not present

## 2021-05-23 DIAGNOSIS — L603 Nail dystrophy: Secondary | ICD-10-CM | POA: Diagnosis not present

## 2021-05-26 ENCOUNTER — Encounter: Payer: Self-pay | Admitting: Podiatry

## 2021-05-26 NOTE — Progress Notes (Signed)
  Subjective:  Patient ID: Brenda Good, female    DOB: [birth year],  MRN: [id number]  Chief Complaint  Patient presents with   Nail Problem    2 week follow up after toenail removal    DOS: 02/03/2021 Procedure: Right foot bunionectomy Lapidus and Akin, second metatarsal Weil osteotomy and hammertoe repair with bone graft from right heel  60 y.o. female returns for follow-up from total toenail removals on the left foot.  Has been more sore than the last time.  She has the CT scan scheduled for the right foot   Review of Systems: Negative except as noted in the HPI. Denies N/V/F/Ch.   Objective:  There were no vitals filed for this visit. There is no height or weight on file to calculate BMI. Constitutional Well developed. Well nourished.  Vascular Foot warm and well perfused. Capillary refill normal to all digits.   Neurologic Normal speech. Oriented to person, place, and time. Epicritic sensation to light touch grossly present bilaterally.  Dermatologic Skin healing well without signs of infection. Skin edges well coapted without signs of infection.   Orthopedic: On her right foot she has tenderness to palpation noted about the surgical site.  Moderate edema, correction is maintained  On the left foot she has hallux valgus deformity that is quite severe with rigid hammertoe deformity of the second and semireducible adductovarus hammertoes of the third and fourth toes.  Matricectomy sites are healing well   Radiographs: Status post Lapidus Akin, second metatarsal osteotomy and hammertoe correction, bone graft from heel, the arthrodesis site is still visible and not completely healed, no complication of hardware no fracture or migration of screws or plates, no loosening Assessment:   1. Hallux valgus with bunions, right   2. Hallux valgus with bunions, left   3. Nail dystrophy     Plan:  Patient was evaluated and treated and all questions answered.  S/p foot surgery  right -CT scan is scheduled and I will call her afterwards to determine the neck steps for this.  We discussed that we may need to delay her left foot surgery pending results of the CT   Hallux valgus and hammertoes left foot Surgery is planned for September 9 we previously had completed the paperwork for this    -Matricectomy sites are healing well she may resume regular bathing and discontinue soaks and ointment at this point.  I dispensed silicone toe caps to offload the painful ones for her work shoes   Return for will call after CT scan.

## 2021-05-30 ENCOUNTER — Ambulatory Visit
Admission: RE | Admit: 2021-05-30 | Discharge: 2021-05-30 | Disposition: A | Discharge status: Home / Self Care | Payer: BC Managed Care – PPO | Source: Ambulatory Visit | Attending: Podiatry | Admitting: Podiatry

## 2021-05-30 DIAGNOSIS — M960 Pseudarthrosis after fusion or arthrodesis: Secondary | ICD-10-CM

## 2021-06-19 ENCOUNTER — Telehealth: Payer: Self-pay | Admitting: Urology

## 2021-06-19 NOTE — Telephone Encounter (Addendum)
DOS - 09/15/21  AIKEN OSTEOTOMY LEFT --- 33612 LAPIDUS PROCEDURE INCLUDING BUNIONECTOMY LEFT --- 24497 METATARSAL OSTEO 2ND LEFT --- 53005 TENOTOMY MULTIPLE LEFT --- 11021 HAMMERTOE REPAIR 2ND LEFT --- 11735   [employer] EFFECTIVE DATE - 10/05/20   PLAN DEDUCTIBLE - $5,000.00 W/ $0.00 REMAINING OUT OF POCKET - $8,550.00 W/ $0.00 REMAINING COINSURANCE - 30% COPAY -  $0.00   NO PRIOR AUTH REQUIRED

## 2021-06-20 ENCOUNTER — Other Ambulatory Visit: Payer: Self-pay | Admitting: Podiatry

## 2021-06-22 ENCOUNTER — Telehealth: Payer: Self-pay | Admitting: Podiatry

## 2021-06-22 NOTE — Telephone Encounter (Signed)
Patient calling to request a call back in regard to surgery. She is having surgery on another part of her body close to the same date as her foot sx. Patient would like to know if it will be ok for her to have 2 sx back to back. She will accept a call back but would prefer to come in to discuss. Please advise.

## 2021-06-22 NOTE — Telephone Encounter (Signed)
LVM for patient to call back and schedule

## 2021-06-22 NOTE — Telephone Encounter (Signed)
Patient is scheduled   

## 2021-07-03 ENCOUNTER — Ambulatory Visit (INDEPENDENT_AMBULATORY_CARE_PROVIDER_SITE_OTHER): Payer: BC Managed Care – PPO

## 2021-07-03 ENCOUNTER — Ambulatory Visit: Payer: BC Managed Care – PPO

## 2021-07-03 ENCOUNTER — Ambulatory Visit (INDEPENDENT_AMBULATORY_CARE_PROVIDER_SITE_OTHER): Payer: BC Managed Care – PPO | Admitting: Podiatry

## 2021-07-03 ENCOUNTER — Other Ambulatory Visit: Payer: Self-pay

## 2021-07-03 DIAGNOSIS — M2011 Hallux valgus (acquired), right foot: Secondary | ICD-10-CM

## 2021-07-03 DIAGNOSIS — M2012 Hallux valgus (acquired), left foot: Secondary | ICD-10-CM | POA: Diagnosis not present

## 2021-07-03 DIAGNOSIS — M21612 Bunion of left foot: Secondary | ICD-10-CM | POA: Diagnosis not present

## 2021-07-03 DIAGNOSIS — M21611 Bunion of right foot: Secondary | ICD-10-CM | POA: Diagnosis not present

## 2021-07-05 ENCOUNTER — Encounter: Payer: Self-pay | Admitting: Podiatry

## 2021-07-05 NOTE — Telephone Encounter (Signed)
Can you reschedule her to Nov 11th? Thanks!

## 2021-07-05 NOTE — Progress Notes (Signed)
  Subjective:  Patient ID: Brenda Good, female    DOB: [birth year],  MRN: [id number]  Chief Complaint  Patient presents with   Bunions    Follow up after CT scan, surgery planned for 07/14/21    DOS: 02/03/2021 Procedure: Right foot bunionectomy Lapidus and Akin, second metatarsal Weil osteotomy and hammertoe repair with bone graft from right heel  60 y.o. female returns for follow-up on her right foot delayed union of her Lapidus as well as the presurgery planning visit for the left foot.  Right foot continues to be somewhat sore and swells occasionally she has been using the bone stimulator as I directed.  Surgery scheduled for next Friday from left foot.  She has upcoming colorectal surgery as well with Dr. Vinson Moselle in [place]  Review of Systems: Negative except as noted in the HPI. Denies N/V/F/Ch.   Objective:  There were no vitals filed for this visit. There is no height or weight on file to calculate BMI. Constitutional Well developed. Well nourished.  Vascular Foot warm and well perfused. Capillary refill normal to all digits.   Neurologic Normal speech. Oriented to person, place, and time. Epicritic sensation to light touch grossly present bilaterally.  Dermatologic Skin healing well without signs of infection. Skin edges well coapted without signs of infection.   Orthopedic: On her right foot she has tenderness to palpation noted about the surgical site.  Moderate edema, correction is maintained  On the left foot she has hallux valgus deformity that is quite severe with rigid hammertoe deformity of the second and semireducible adductovarus hammertoes of the third and fourth toes.  Matricectomy sites are healing well   Multiple view radiographs: Status post Lapidus Akin, second metatarsal osteotomy and hammertoe correction, bone graft from heel, the arthrodesis site is still visible and not completely healed, no complication of hardware no fracture or migration of screws or  plates, no loosening; this is improved somewhat since last visit but still has signs concerning for a delayed union Assessment:   1. Hallux valgus with bunions, left   2. Hallux valgus with bunions, right     Plan:  Patient was evaluated and treated and all questions answered.  S/p foot surgery right -I reviewed the clinical and radiographic findings with the patient and her husband.  I think she should continue the bone stimulator for another 2 months until she has radiographic union and is symptom-free.  Unfortunately I think that going ahead with her surgery left foot next week would possibly compromise or negatively impact the healing on the right foot as well.  Additionally she has colorectal surgery coming up.  I spoke with her surgeon Dr. Maryruth Bun and he says that he would be able to get her in for this in the next couple of weeks.  I recommend we delay her left foot surgery until early November when her right foot has more appropriately healed and she has recovered fully from her colorectal surgery.  Obviously this is disappointing but I think it is in her best interest for long-term satisfactory result to hopefully avoid any revision surgery on the right foot     Return in about 8 weeks (around 08/28/2021) for post op (new x-rays right foot).

## 2021-07-20 ENCOUNTER — Encounter: Payer: BC Managed Care – PPO | Admitting: Podiatry

## 2021-07-21 ENCOUNTER — Encounter: Payer: BC Managed Care – PPO | Admitting: Podiatry

## 2021-08-03 ENCOUNTER — Encounter: Payer: BC Managed Care – PPO | Admitting: Podiatry

## 2021-08-24 ENCOUNTER — Encounter: Payer: BC Managed Care – PPO | Admitting: Podiatry

## 2021-09-05 DIAGNOSIS — M79676 Pain in unspecified toe(s): Secondary | ICD-10-CM

## 2021-09-15 ENCOUNTER — Other Ambulatory Visit: Payer: Self-pay | Admitting: Podiatry

## 2021-09-15 DIAGNOSIS — M21542 Acquired clubfoot, left foot: Secondary | ICD-10-CM | POA: Diagnosis not present

## 2021-09-15 DIAGNOSIS — M2042 Other hammer toe(s) (acquired), left foot: Secondary | ICD-10-CM | POA: Diagnosis not present

## 2021-09-15 DIAGNOSIS — M2012 Hallux valgus (acquired), left foot: Secondary | ICD-10-CM | POA: Diagnosis not present

## 2021-09-15 DIAGNOSIS — M2062 Acquired deformities of toe(s), unspecified, left foot: Secondary | ICD-10-CM | POA: Diagnosis not present

## 2021-09-15 MED ORDER — OXYCODONE HCL 5 MG PO TABS: 5.0000 mg | ORAL_TABLET | ORAL | 0 refills | Status: AC | PRN

## 2021-09-15 MED ORDER — NAPROXEN SODIUM 550 MG PO TABS: 550.0000 mg | ORAL_TABLET | Freq: Two times a day (BID) | ORAL | 0 refills | Status: AC

## 2021-09-15 MED ORDER — ASPIRIN EC 325 MG PO TBEC: 325.0000 mg | DELAYED_RELEASE_TABLET | Freq: Two times a day (BID) | ORAL | 0 refills | Status: DC

## 2021-09-15 MED ORDER — ACETAMINOPHEN 500 MG PO TABS: 1000.0000 mg | ORAL_TABLET | Freq: Four times a day (QID) | ORAL | 0 refills | Status: AC | PRN

## 2021-09-15 NOTE — Progress Notes (Signed)
11/11 [facility] L foot lapidus, 2nd metatarsal osteotomy, 2nd hammertoe correction, tenotomy 3/4

## 2021-09-21 ENCOUNTER — Other Ambulatory Visit: Payer: Self-pay

## 2021-09-21 ENCOUNTER — Ambulatory Visit (INDEPENDENT_AMBULATORY_CARE_PROVIDER_SITE_OTHER): Payer: BC Managed Care – PPO

## 2021-09-21 ENCOUNTER — Ambulatory Visit (INDEPENDENT_AMBULATORY_CARE_PROVIDER_SITE_OTHER): Payer: BC Managed Care – PPO | Admitting: Podiatry

## 2021-09-21 DIAGNOSIS — M21612 Bunion of left foot: Secondary | ICD-10-CM

## 2021-09-21 DIAGNOSIS — M2012 Hallux valgus (acquired), left foot: Secondary | ICD-10-CM | POA: Diagnosis not present

## 2021-09-21 DIAGNOSIS — M2042 Other hammer toe(s) (acquired), left foot: Secondary | ICD-10-CM

## 2021-09-21 DIAGNOSIS — M21962 Unspecified acquired deformity of left lower leg: Secondary | ICD-10-CM

## 2021-09-25 NOTE — Progress Notes (Signed)
  Subjective:  Patient ID: Brenda Good, female    DOB: 09/12/1961,  MRN: [id number]  Chief Complaint  Patient presents with   Routine Post Op    (xray)POV #1 DOS 09/15/2021 LT FOOT BUNION CORRECTION & HAMMERTOE CORRECTION 2ND, TENDON RELEASE 3/4, BONE GRAFT FROM HEEL, BONE OUT IN METATARSAL     60 y.o. female returns for post-op check.  Overall doing well she been having more pain and swelling since surgery compared to her prior surgery.  Right foot feels well and is not having much pain or swelling at all  Review of Systems: Negative except as noted in the HPI. Denies N/V/F/Ch.   Objective:  There were no vitals filed for this visit. There is no height or weight on file to calculate BMI. Constitutional Well developed. Well nourished.  Vascular Foot warm and well perfused. Capillary refill normal to all digits.   Neurologic Normal speech. Oriented to person, place, and time. Epicritic sensation to light touch grossly present bilaterally.  Dermatologic Skin healing well without signs of infection. Skin edges well coapted without signs of infection.  Orthopedic: Tenderness to palpation noted about the surgical site.   Multiple view plain film radiographs: Status post Lapidus arthrodesis, Weil osteotomy and hammertoe correction with wire fixation and Aiken osteotomy.  Hardware in good position and intact with good correction, similar to immediate postop films Assessment:   1. Hallux valgus with bunions, left   2. Metatarsal deformity, left   3. Hammertoe of left foot    Plan:  Patient was evaluated and treated and all questions answered.  S/p foot surgery left -Progressing as expected post-operatively. -XR: As above.  I recommend we get right foot x-rays next time to reevaluate her right foot -WB Status: NWB in CAM boot -Sutures: Removed next visit. -Medications: No refills required -Foot redressed.  She may begin bathing on Monday. -Recommend she begin using her  ultrasound bone stimulator on the left foot now  Return in about 2 weeks (around 10/05/2021) for suture removal ( XRAY RIGHT FOOT).

## 2021-10-05 ENCOUNTER — Other Ambulatory Visit: Payer: Self-pay

## 2021-10-05 ENCOUNTER — Ambulatory Visit (INDEPENDENT_AMBULATORY_CARE_PROVIDER_SITE_OTHER): Payer: BC Managed Care – PPO | Admitting: Podiatry

## 2021-10-05 DIAGNOSIS — M2042 Other hammer toe(s) (acquired), left foot: Secondary | ICD-10-CM

## 2021-10-05 DIAGNOSIS — M2012 Hallux valgus (acquired), left foot: Secondary | ICD-10-CM

## 2021-10-05 DIAGNOSIS — M21962 Unspecified acquired deformity of left lower leg: Secondary | ICD-10-CM

## 2021-10-05 DIAGNOSIS — M21612 Bunion of left foot: Secondary | ICD-10-CM

## 2021-10-05 NOTE — Progress Notes (Signed)
  Subjective:  Patient ID: Brenda Good, female    DOB: 06-Nov-1960,  MRN: [id number]  Chief Complaint  Patient presents with   Routine Post Op      POV #2 DOS 09/15/2021 LT FOOT BUNION CORRECTION & HAMMERTOE CORRECTION 2ND, TENDON RELEASE 3/4, BONE GRAFT FROM HEEL, BONE OUT IN METATARSAL     60 y.o. female returns for post-op check.  Overall doing well she been having more pain and swelling since surgery compared to her prior surgery.  Right foot feels well and is not having much pain or swelling at all  Review of Systems: Negative except as noted in the HPI. Denies N/V/F/Ch.   Objective:  There were no vitals filed for this visit. There is no height or weight on file to calculate BMI. Constitutional Well developed. Well nourished.  Vascular Foot warm and well perfused. Capillary refill normal to all digits.   Neurologic Normal speech. Oriented to person, place, and time. Epicritic sensation to light touch grossly present bilaterally.  Dermatologic Skin healing well without signs of infection. Skin edges well coapted without signs of infection.  Orthopedic: Tenderness to palpation noted about the surgical site.   Multiple view plain film radiographs: Status post Lapidus arthrodesis, Weil osteotomy and hammertoe correction with wire fixation and Aiken osteotomy.  Hardware in good position and intact with good correction, similar to immediate postop films Assessment:   1. Hallux valgus with bunions, left   2. Metatarsal deformity, left   3. Hammertoe of left foot     Plan:  Patient was evaluated and treated and all questions answered.  S/p foot surgery left -Doing well she can begin WBAT in the CAM boot gently I reviewed this process with her, sutures removed today uneventfully.  Follow-up in 3 weeks for new x-rays of both feet  Return in about 3 weeks (around 10/26/2021) for post op (new x-rays).

## 2021-10-07 ENCOUNTER — Other Ambulatory Visit: Payer: Self-pay | Admitting: Podiatry

## 2021-10-09 ENCOUNTER — Telehealth: Payer: Self-pay | Admitting: *Deleted

## 2021-10-09 MED ORDER — CEPHALEXIN 500 MG PO CAPS: 500.0000 mg | ORAL_CAPSULE | Freq: Three times a day (TID) | ORAL | 0 refills | Status: DC

## 2021-10-09 MED ORDER — FLUCONAZOLE 150 MG PO TABS: 150.0000 mg | ORAL_TABLET | Freq: Once | ORAL | 0 refills | Status: AC

## 2021-10-09 NOTE — Telephone Encounter (Signed)
Patient is calling because a medication for a yeast infection ,and an antibiotic for her foot,was supposed to be called into pharmacy on Thursday.Please advise.

## 2021-10-09 NOTE — Telephone Encounter (Signed)
Please advise 

## 2021-10-09 NOTE — Telephone Encounter (Signed)
Patient has been notified of medication sent to pharmacy 

## 2021-10-26 ENCOUNTER — Other Ambulatory Visit: Payer: Self-pay

## 2021-10-26 ENCOUNTER — Ambulatory Visit (INDEPENDENT_AMBULATORY_CARE_PROVIDER_SITE_OTHER): Payer: Managed Care, Other (non HMO)

## 2021-10-26 ENCOUNTER — Encounter: Payer: BC Managed Care – PPO | Admitting: Podiatry

## 2021-10-26 ENCOUNTER — Ambulatory Visit (INDEPENDENT_AMBULATORY_CARE_PROVIDER_SITE_OTHER): Payer: Self-pay | Admitting: Podiatry

## 2021-10-26 ENCOUNTER — Ambulatory Visit: Payer: Self-pay

## 2021-10-26 DIAGNOSIS — M2011 Hallux valgus (acquired), right foot: Secondary | ICD-10-CM

## 2021-10-26 DIAGNOSIS — M2012 Hallux valgus (acquired), left foot: Secondary | ICD-10-CM

## 2021-10-26 DIAGNOSIS — M21612 Bunion of left foot: Secondary | ICD-10-CM

## 2021-10-26 DIAGNOSIS — M21611 Bunion of right foot: Secondary | ICD-10-CM

## 2021-10-27 ENCOUNTER — Other Ambulatory Visit: Payer: Self-pay | Admitting: Podiatry

## 2021-10-27 ENCOUNTER — Encounter: Payer: Self-pay | Admitting: Podiatry

## 2021-10-27 NOTE — Progress Notes (Signed)
°  Subjective:  Patient ID: Brenda Good, female    DOB: November 06, 1960,  MRN: [id number]  Chief Complaint  Patient presents with   Routine Post Op     (xray)POV #3 DOS  09/15/2021 LT FOOT BUNION CORRECTION & HAMMERTOE CORRECTION 2ND, TENDON RELEASE 3/4, BONE GRAFT FROM HEEL, BONE OUT IN METATARSAL      60 y.o. female returns for post-op check.  She is doing well she not having much pain so far.  Right foot continues to feel well.  Review of Systems: Negative except as noted in the HPI. Denies N/V/F/Ch.   Objective:  There were no vitals filed for this visit. There is no height or weight on file to calculate BMI. Constitutional Well developed. Well nourished.  Vascular Foot warm and well perfused. Capillary refill normal to all digits.   Neurologic Normal speech. Oriented to person, place, and time. Epicritic sensation to light touch grossly present bilaterally.  Dermatologic Skin healing well without signs of infection. Skin edges well coapted without signs of infection.  Orthopedic: Mild edema and minimal tenderness to palpation noted about the surgical site.   Multiple view plain film radiographs: Status post Lapidus arthrodesis, Weil osteotomy and hammertoe correction with wire fixation and Aiken osteotomy.  Hardware in good position and intact with good correction, some early bridging across the arthrodesis site  Right foot x-rays show improvement in consolidation across the arthrodesis site Assessment:   1. Hallux valgus with bunions, left   2. Hallux valgus with bunions, right     Plan:  Patient was evaluated and treated and all questions answered.  S/p foot surgery left -Continue WBAT as tolerated in the cam boot on the left side.  Use the bone stimulator on the side exclusively now.  Would like to see more consolidation across the arthrodesis site at her next visit on the x-rays.  If not seeing much more than would want to get CT scan.  -Right foot now has quite a  bit of improvement  She has recurrence of toenail from prior total matricectomy we will repeat this at her next visit as well  Return in about 1 month (around 11/26/2021) for post op (new x-rays).

## 2021-10-31 ENCOUNTER — Other Ambulatory Visit: Payer: Self-pay | Admitting: Podiatry

## 2021-10-31 DIAGNOSIS — M2011 Hallux valgus (acquired), right foot: Secondary | ICD-10-CM

## 2021-11-07 ENCOUNTER — Other Ambulatory Visit: Payer: Self-pay | Admitting: Podiatry

## 2021-11-07 NOTE — Telephone Encounter (Signed)
Please advise 

## 2021-11-28 ENCOUNTER — Other Ambulatory Visit: Payer: Self-pay

## 2021-11-28 ENCOUNTER — Ambulatory Visit (INDEPENDENT_AMBULATORY_CARE_PROVIDER_SITE_OTHER): Payer: Managed Care, Other (non HMO)

## 2021-11-28 ENCOUNTER — Ambulatory Visit (INDEPENDENT_AMBULATORY_CARE_PROVIDER_SITE_OTHER): Payer: Managed Care, Other (non HMO) | Admitting: Podiatry

## 2021-11-28 DIAGNOSIS — M2012 Hallux valgus (acquired), left foot: Secondary | ICD-10-CM

## 2021-11-28 DIAGNOSIS — M21612 Bunion of left foot: Secondary | ICD-10-CM

## 2021-11-28 DIAGNOSIS — M960 Pseudarthrosis after fusion or arthrodesis: Secondary | ICD-10-CM

## 2021-12-03 ENCOUNTER — Encounter: Payer: Self-pay | Admitting: Podiatry

## 2021-12-03 NOTE — Progress Notes (Signed)
°  Subjective:  Patient ID: Brenda Good, female    DOB: 06-14-1961,  MRN: [id number]  Chief Complaint  Patient presents with   Routine Post Op     (xray)POV #4 DOS  09/15/2021 LT FOOT BUNION CORRECTION & HAMMERTOE CORRECTION 2ND, TENDON RELEASE 3/4, BONE GRAFT FROM HEEL, BONE OUT IN METATARSAL      61 y.o. female returns for post-op check.  Right foot is doing well her left foot does not hurt too much but it does continue to swell Review of Systems: Negative except as noted in the HPI. Denies N/V/F/Ch.   Objective:  There were no vitals filed for this visit. There is no height or weight on file to calculate BMI. Constitutional Well developed. Well nourished.  Vascular Foot warm and well perfused. Capillary refill normal to all digits.   Neurologic Normal speech. Oriented to person, place, and time. Epicritic sensation to light touch grossly present bilaterally.  Dermatologic Skin healing well without signs of infection. Skin edges well coapted without signs of infection.  Orthopedic: Mild edema and minimal tenderness to palpation noted about the surgical site.   Multiple view plain film radiographs: Left foot x-ray showed no change in hardware alignment or complication, there is persistent lucency at the arthrodesis site of the first TMT J  Right foot x-rays show improvement in consolidation across the arthrodesis site Assessment:   1. Pseudarthrosis after fusion or arthrodesis   2. Hallux valgus with bunions, left     Plan:  Patient was evaluated and treated and all questions answered.  S/p foot surgery left -I have not seen much progress in the arthrodesis of the left foot.  I think she is approaching a delayed union here as well.  Hopefully can resolve with bone stimulation which I encouraged her to continue for the left side now as well.  Use the stimulator on the left side exclusively.  I have also ordered a CT to evaluate the level of healing here.  I will see her  back after the CT scan to review.  Return in about 1 month (around 12/29/2021) for post op (new x-rays).

## 2021-12-25 ENCOUNTER — Other Ambulatory Visit: Payer: Self-pay

## 2021-12-25 ENCOUNTER — Ambulatory Visit
Admission: RE | Admit: 2021-12-25 | Discharge: 2021-12-25 | Disposition: A | Discharge status: Home / Self Care | Payer: Self-pay | Source: Ambulatory Visit | Attending: Podiatry | Admitting: Podiatry

## 2021-12-25 DIAGNOSIS — M960 Pseudarthrosis after fusion or arthrodesis: Secondary | ICD-10-CM

## 2022-01-01 ENCOUNTER — Ambulatory Visit (INDEPENDENT_AMBULATORY_CARE_PROVIDER_SITE_OTHER): Payer: No Typology Code available for payment source | Admitting: Podiatry

## 2022-01-01 ENCOUNTER — Other Ambulatory Visit: Payer: Self-pay

## 2022-01-01 DIAGNOSIS — M960 Pseudarthrosis after fusion or arthrodesis: Secondary | ICD-10-CM

## 2022-01-01 DIAGNOSIS — M2012 Hallux valgus (acquired), left foot: Secondary | ICD-10-CM

## 2022-01-01 DIAGNOSIS — M21612 Bunion of left foot: Secondary | ICD-10-CM | POA: Diagnosis not present

## 2022-01-02 NOTE — Progress Notes (Signed)
°  Subjective:  Patient ID: Brenda Good, female    DOB: 07/29/1961,  MRN: [id number]  Chief Complaint  Patient presents with   Arthritis     DOS  09/15/2021 LT FOOT BUNION CORRECTION & HAMMERTOE CORRECTION 2ND, TENDON RELEASE 3/4, BONE GRAFT FROM HEEL, BONE OUT IN METATARSAL      61 y.o. female returns for post-op check.  Right foot is doing well her left foot does not hurt too much but it does continue to swell Review of Systems: Negative except as noted in the HPI. Denies N/V/F/Ch.  Since last visit she has completed a CT scan  Objective:  There were no vitals filed for this visit. There is no height or weight on file to calculate BMI. Constitutional Well developed. Well nourished.  Vascular Foot warm and well perfused. Capillary refill normal to all digits.   Neurologic Normal speech. Oriented to person, place, and time. Epicritic sensation to light touch grossly present bilaterally.  Dermatologic Skin healing well without signs of infection. Skin edges well coapted without signs of infection.  Orthopedic: Mild edema and minimal tenderness to palpation noted about the surgical site.   Multiple view plain film radiographs: Left foot x-ray showed no change in hardware alignment or complication, there is persistent lucency at the arthrodesis site of the first TMT J  Right foot x-rays show improvement in consolidation across the arthrodesis site  Left foot CT 12/25/2021 IMPRESSION: 1. Prior arthrodesis of the first TMT joint with a dorsal and medial sideplate and interlocking screws. No significant bone bridging across the first TMT joint. 2. Healed first proximal phalangeal osteotomy transfixed with a single screw. 3. 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 4. 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     Electronically Signed   By: Elige Ko M.D.   On: 12/25/2021 15:30 Assessment:   1. Pseudarthrosis after fusion or arthrodesis   2. Hallux valgus  with bunions, left     Plan:  Patient was evaluated and treated and all questions answered.  S/p foot surgery left -I reviewed the CT scan independently and with the patient and her husband today in the office.  I discussed that there has not been any appreciable fusion across the arthrodesis site.  I recommended she try to rest and offload the foot is much as possible use the boot and knee scooter is much as possible.  I think she should continue to use the noninvasive bone stimulator and we will have this recertified for the left foot with a continuation prescription.  I will see her back in 1 month for new x-rays.  We discussed the possibility if it does not heal that she may require revision surgery.  Return in about 1 month (around 01/29/2022) for surgery follow up  (new x-rays).

## 2022-01-29 ENCOUNTER — Other Ambulatory Visit: Payer: Self-pay

## 2022-01-29 ENCOUNTER — Ambulatory Visit (INDEPENDENT_AMBULATORY_CARE_PROVIDER_SITE_OTHER): Payer: Managed Care, Other (non HMO) | Admitting: Podiatry

## 2022-01-29 ENCOUNTER — Ambulatory Visit (INDEPENDENT_AMBULATORY_CARE_PROVIDER_SITE_OTHER): Payer: No Typology Code available for payment source

## 2022-01-29 DIAGNOSIS — M960 Pseudarthrosis after fusion or arthrodesis: Secondary | ICD-10-CM

## 2022-01-29 DIAGNOSIS — M21612 Bunion of left foot: Secondary | ICD-10-CM

## 2022-01-29 DIAGNOSIS — M2012 Hallux valgus (acquired), left foot: Secondary | ICD-10-CM

## 2022-01-31 NOTE — Progress Notes (Signed)
?  Subjective:  ?Patient ID: Brenda Good, female    DOB: [birth year],  MRN: [id number] ? ?Chief Complaint  ?Patient presents with  ? Bunions  ?  1 month for surgery f/u pov 09/15/21 new xray left  ? ? ? ?62 y.o. female returns for post-op check.  Still having some swelling.  No pain in the left foot.  She has been using the bone stimulator. ? ?Review of Systems: Negative except as noted in the HPI. Denies N/V/F/Ch. ? ?Since last visit she has completed a CT scan ? ?Objective:  ?There were no vitals filed for this visit. ?There is no height or weight on file to calculate BMI. ?Constitutional Well developed. ?Well nourished.  ?Vascular Foot warm and well perfused. ?Capillary refill normal to all digits.   ?Neurologic Normal speech. ?Oriented to person, place, and time. ?Epicritic sensation to light touch grossly present bilaterally.  ?Dermatologic Skin healing well without signs of infection. Skin edges well coapted without signs of infection.  ?Orthopedic: Mild edema and minimal tenderness to palpation noted about the surgical site.  ? ?Multiple view plain film radiographs: New left foot films taken today show no change in hardware alignment or complication, there is persistent lucency at the fusion site ? ?Right foot x-rays show improvement in consolidation across the arthrodesis site ? ?Left foot CT 12/25/2021 ?IMPRESSION: ?1. Prior arthrodesis of the first TMT joint with a dorsal and medial ?sideplate and interlocking screws. No significant bone bridging ?across the first TMT joint. ?2. Healed first proximal phalangeal osteotomy transfixed with a ?single screw. ?3. 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 ?4. 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 ?  ?  ?Electronically Signed ?  By: Elige Ko M.D. ?  On: 12/25/2021 15:30 ?Assessment:  ? ?1. Hallux valgus with bunions, left   ?2. Pseudarthrosis after fusion or arthrodesis   ? ? ?Plan:  ?Patient was evaluated and treated and all questions  answered. ? ?S/p foot surgery left ?-Today we again reviewed her left foot radiographs.  I recommend she continue weightbearing only in the cam boot and try to rest is much as possible.  Continue using the bone stimulator device.  Her right foot did eventually turn a corner and began fusion.  I will see her back in 1 month.  If no appreciable change in radiographs would order a new CT scan.  If not improved by mid May likely need surgical revision. ? ?Return in about 1 month (around 03/01/2022) for bunion surgery follow up, new xrays .  ? ?

## 2022-03-06 ENCOUNTER — Ambulatory Visit (INDEPENDENT_AMBULATORY_CARE_PROVIDER_SITE_OTHER): Payer: Managed Care, Other (non HMO) | Admitting: Podiatry

## 2022-03-06 ENCOUNTER — Ambulatory Visit (INDEPENDENT_AMBULATORY_CARE_PROVIDER_SITE_OTHER): Payer: Managed Care, Other (non HMO)

## 2022-03-06 ENCOUNTER — Encounter: Payer: Self-pay | Admitting: Podiatry

## 2022-03-06 DIAGNOSIS — M21612 Bunion of left foot: Secondary | ICD-10-CM | POA: Diagnosis not present

## 2022-03-06 DIAGNOSIS — M960 Pseudarthrosis after fusion or arthrodesis: Secondary | ICD-10-CM | POA: Diagnosis not present

## 2022-03-06 DIAGNOSIS — M2012 Hallux valgus (acquired), left foot: Secondary | ICD-10-CM | POA: Diagnosis not present

## 2022-03-06 NOTE — Progress Notes (Signed)
?  Subjective:  ?Patient ID: Brenda Good, female    DOB: 04/17/1961,  MRN: [id number] ? ?Chief Complaint  ?Patient presents with  ? Bunions  ?   (xray)1 month for bunion surgery follow up, new xrays .  ? ? ? ?61 y.o. female returns for post-op check.  Still having swelling so she has occasional tenderness still using the boot is uncomfortable.  She has been using the bone stimulator. ? ?Review of Systems: Negative except as noted in the HPI. Denies N/V/F/Ch. ? ?Since last visit she has completed a CT scan ? ?Objective:  ?There were no vitals filed for this visit. ?There is no height or weight on file to calculate BMI. ?Constitutional Well developed. ?Well nourished.  ?Vascular Foot warm and well perfused. ?Capillary refill normal to all digits.   ?Neurologic Normal speech. ?Oriented to person, place, and time. ?Epicritic sensation to light touch grossly present bilaterally.  ?Dermatologic Incisions well-healed and not hypertrophic  ?Orthopedic: Mild edema and minimal tenderness to palpation noted about the first tarsometatarsal joint  ? ?Multiple view plain film radiographs: New left foot films taken today show no change in hardware alignment or complication, there is persistent lucency at the fusion site, no appreciable change since last visit ? ?Right foot x-rays show improvement in consolidation across the arthrodesis site ? ?Left foot CT 12/25/2021 ?IMPRESSION: ?1. Prior arthrodesis of the first TMT joint with a dorsal and medial ?sideplate and interlocking screws. No significant bone bridging ?across the first TMT joint. ?2. Healed first proximal phalangeal osteotomy transfixed with a ?single screw. ?3. 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 ?4. 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 ?  ?  ?Electronically Signed ?  By: Elige Ko M.D. ?  On: 12/25/2021 15:30 ?Assessment:  ? ?1. Pseudarthrosis after fusion or arthrodesis   ?2. Hallux valgus with bunions, left   ? ? ?Plan:  ?Patient was  evaluated and treated and all questions answered. ? ?S/p foot surgery left ?-She has had no appreciable change in the fusion of the first tarsometatarsal joint.  I recommended we reevaluate with repeat CT scan, if I see progression from her last scan then we will continue bone stimulation and protected weightbearing.  If I do not see any significant change and I think we should consider revision surgery of her nonunion CT scan has been ordered and I will see her back after this. ? ?Return for after CT to review.  ? ?

## 2022-03-06 NOTE — Patient Instructions (Signed)
Call [facility] at [phone] to schedule your CT ? ?

## 2022-03-07 ENCOUNTER — Encounter: Payer: Self-pay | Admitting: Podiatry

## 2022-03-29 ENCOUNTER — Ambulatory Visit
Admission: RE | Admit: 2022-03-29 | Discharge: 2022-03-29 | Disposition: A | Discharge status: Home / Self Care | Payer: Managed Care, Other (non HMO) | Source: Ambulatory Visit | Attending: Podiatry | Admitting: Podiatry

## 2022-03-29 DIAGNOSIS — M960 Pseudarthrosis after fusion or arthrodesis: Secondary | ICD-10-CM

## 2022-04-04 ENCOUNTER — Encounter: Payer: Self-pay | Admitting: Podiatry

## 2022-04-06 ENCOUNTER — Encounter: Payer: Self-pay | Admitting: Podiatry

## 2022-04-06 DIAGNOSIS — E559 Vitamin D deficiency, unspecified: Secondary | ICD-10-CM

## 2022-04-06 NOTE — Telephone Encounter (Signed)
Responded in different [facility]

## 2022-04-10 ENCOUNTER — Encounter: Payer: Self-pay | Admitting: Podiatry

## 2022-04-10 ENCOUNTER — Telehealth: Payer: Self-pay | Admitting: *Deleted

## 2022-04-10 NOTE — Telephone Encounter (Signed)
Patient is calling for the order for the vitamin D test be sent to [facility]. Faxed order to [address] location. Confirmation received 04/10/22.

## 2022-04-10 NOTE — Telephone Encounter (Signed)
Please schedule, thanks.

## 2022-04-11 LAB — VITAMIN D 25 HYDROXY (VIT D DEFICIENCY, FRACTURES): Vit D, 25-Hydroxy: 71.7 ng/mL (ref 30.0–100.0)

## 2022-04-11 LAB — CALCIUM: Calcium: 11.5 mg/dL — ABNORMAL HIGH (ref 8.7–10.3)

## 2022-04-12 ENCOUNTER — Ambulatory Visit (INDEPENDENT_AMBULATORY_CARE_PROVIDER_SITE_OTHER): Payer: Managed Care, Other (non HMO) | Admitting: Podiatry

## 2022-04-12 ENCOUNTER — Encounter: Payer: Self-pay | Admitting: Podiatry

## 2022-04-12 ENCOUNTER — Telehealth: Payer: Self-pay | Admitting: Urology

## 2022-04-12 DIAGNOSIS — M960 Pseudarthrosis after fusion or arthrodesis: Secondary | ICD-10-CM

## 2022-04-12 DIAGNOSIS — M8970 Major osseous defect, unspecified site: Secondary | ICD-10-CM

## 2022-04-12 DIAGNOSIS — M21962 Unspecified acquired deformity of left lower leg: Secondary | ICD-10-CM

## 2022-04-12 NOTE — Progress Notes (Signed)
Subjective:  Patient ID: Brenda Good, female    DOB: 1961-01-10,  MRN: [id number]  Chief Complaint  Patient presents with   Follow-up    Patient here to go over CT Scan.     61 y.o. female returns for post-op check.  She returns today after completing the CT scan  Review of Systems: Negative except as noted in the HPI. Denies N/V/F/Ch.  Since last visit she has completed a CT scan  Objective:  There were no vitals filed for this visit. There is no height or weight on file to calculate BMI. Constitutional Well developed. Well nourished.  Vascular Foot warm and well perfused. Capillary refill normal to all digits.   Neurologic Normal speech. Oriented to person, place, and time. Epicritic sensation to light touch grossly present bilaterally.  Dermatologic Incisions well-healed and not hypertrophic  Orthopedic: Mild edema and minimal tenderness to palpation noted about the first tarsometatarsal joint   Multiple view plain film radiographs: no change in hardware alignment or complication, there is persistent lucency at the fusion site, no appreciable change since last visit  Right foot x-rays show improvement in consolidation across the arthrodesis site  Left foot CT 12/25/2021 IMPRESSION: 1. Prior arthrodesis of the first TMT joint with a dorsal and medial sideplate and interlocking screws. No significant bone bridging across the first TMT joint. 2. Healed first proximal phalangeal osteotomy transfixed with a single screw. 3. 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 4. 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     Electronically Signed   By: Kathreen Devoid M.D.   On: 12/25/2021 15:30   IMPRESSION: 1. Similar appearance of the first TMT joint arthrodesis without any bony fusion across the joint. Unchanged fracture of the proximal most horizontally oriented screw. 2. Unchanged severe anterior tibialis tendinosis.     Electronically Signed   By:  Titus Dubin M.D.   On: 03/30/2022 15:54   Assessment:   1. Pseudarthrosis after fusion or arthrodesis   2. Metatarsal deformity, left   3. Major osseous defect      Plan:  Patient was evaluated and treated and all questions answered.  S/p foot surgery left -At this point 6 months after surgery she has had no appreciable fusion across the site.  I did check her vitamin D and calcium which are both appropriately supplemented and in good range for bone healing.  I discussed with her surgical revision.  I think this would be necessary because she is developing elevation of the first metatarsal and is having persistent swelling and pain.  I expect this to worsen and she will have recurrence of bunion or further foot deformity if we do not proceed with surgery.  Surgically we discussed revising the nonunion removal of hardware with a more stable construct and using tricortical autograft from her calcaneus.  I discussed with her the presence of the previous calcaneal graft harvest site from 6 months ago.  We discussed the risk of calcaneal fracture intraoperative or thereafter.  We will plan to replace the calcaneal autograft with a tricortical iliac crest graft with or without fixation if necessary.  We also discussed other possible complications including  pain, swelling, infection, scar, numbness which may be temporary or permanent, chronic pain, stiffness, nerve pain or damage, wound healing problems, bone healing problems including delayed or non-union. She understands these risks and wishes to proceed.  We discussed that she will be nonweightbearing for approximately 8 weeks in a cast with a cast  adapter for the bone stimulator.    Surgical plan:  Procedure: -Revision of Lapidus nonunion left foot, removal of plates and screws as well as Aiken screw, calcaneal autograft harvest  Location: -[facility]  Anesthesia plan: -IV sedation with regional block  Postoperative pain plan: - Tylenol  1000 mg every 6 hours,  gabapentin 300 mg every 8 hours x5 days, oxycodone 5 mg 1-2 tabs every 6 hours only as needed  DVT prophylaxis: -ASA 325 mg twice daily  WB Restrictions / DME needs: -NWB in splint postop   No follow-ups on file.

## 2022-04-12 NOTE — Telephone Encounter (Signed)
DOS - 04/19/22  REMOVAL FIXATION DEEP LEFT --- 20680 REPAIR NONUNION LEFT --- 75102 BONE GRAFT LEFT --- 20900  [employer] EFFECTIVE DATE - 10/05/21  PLAN DEDUCTIBLE - $5,000.00 W/ $4,938.06 REMAINING OUT OF POCKET - $8,700.00 W/ $8,280.96 REMAINING COINSURANCE - 0% COPAY - $0.00   PER [employer]IG[employer]'S AUTOMATIVE SYSTEM FOR CPT CODES 58527, 78242 AND 20900 NO PRIOR AUTH IS REQUIRED.   REF # [id number] REF # [id number] REF # [phone]

## 2022-04-19 ENCOUNTER — Other Ambulatory Visit: Payer: Self-pay | Admitting: Podiatry

## 2022-04-19 DIAGNOSIS — Z4889 Encounter for other specified surgical aftercare: Secondary | ICD-10-CM

## 2022-04-19 DIAGNOSIS — M21962 Unspecified acquired deformity of left lower leg: Secondary | ICD-10-CM

## 2022-04-19 MED ORDER — OXYCODONE HCL 5 MG PO TABS: 5.0000 mg | ORAL_TABLET | ORAL | 0 refills | Status: AC | PRN

## 2022-04-19 MED ORDER — ASPIRIN 325 MG PO TBEC: DELAYED_RELEASE_TABLET | ORAL | 0 refills | Status: DC

## 2022-04-19 MED ORDER — ACETAMINOPHEN 500 MG PO TABS: 1000.0000 mg | ORAL_TABLET | Freq: Four times a day (QID) | ORAL | 0 refills | Status: AC | PRN

## 2022-04-19 NOTE — Progress Notes (Signed)
04/19/22 revision of non-union

## 2022-04-26 ENCOUNTER — Ambulatory Visit (INDEPENDENT_AMBULATORY_CARE_PROVIDER_SITE_OTHER): Payer: Managed Care, Other (non HMO) | Admitting: Podiatry

## 2022-04-26 ENCOUNTER — Ambulatory Visit (INDEPENDENT_AMBULATORY_CARE_PROVIDER_SITE_OTHER): Payer: Managed Care, Other (non HMO)

## 2022-04-26 DIAGNOSIS — M960 Pseudarthrosis after fusion or arthrodesis: Secondary | ICD-10-CM

## 2022-04-26 DIAGNOSIS — M2012 Hallux valgus (acquired), left foot: Secondary | ICD-10-CM

## 2022-04-26 DIAGNOSIS — M21612 Bunion of left foot: Secondary | ICD-10-CM | POA: Diagnosis not present

## 2022-04-29 ENCOUNTER — Encounter: Payer: Self-pay | Admitting: Podiatry

## 2022-05-10 ENCOUNTER — Ambulatory Visit (INDEPENDENT_AMBULATORY_CARE_PROVIDER_SITE_OTHER): Payer: Managed Care, Other (non HMO)

## 2022-05-10 ENCOUNTER — Ambulatory Visit (INDEPENDENT_AMBULATORY_CARE_PROVIDER_SITE_OTHER): Payer: Managed Care, Other (non HMO) | Admitting: Podiatry

## 2022-05-10 DIAGNOSIS — M21962 Unspecified acquired deformity of left lower leg: Secondary | ICD-10-CM

## 2022-05-10 DIAGNOSIS — M2012 Hallux valgus (acquired), left foot: Secondary | ICD-10-CM | POA: Diagnosis not present

## 2022-05-10 DIAGNOSIS — M960 Pseudarthrosis after fusion or arthrodesis: Secondary | ICD-10-CM

## 2022-05-10 DIAGNOSIS — Z9889 Other specified postprocedural states: Secondary | ICD-10-CM

## 2022-05-10 DIAGNOSIS — M21612 Bunion of left foot: Secondary | ICD-10-CM | POA: Diagnosis not present

## 2022-05-11 ENCOUNTER — Other Ambulatory Visit: Payer: Self-pay | Admitting: Podiatry

## 2022-05-11 ENCOUNTER — Encounter: Payer: Self-pay | Admitting: Podiatry

## 2022-05-15 NOTE — Progress Notes (Signed)
  Subjective:  Patient ID: Brenda Good, female    DOB: Feb 13, 1961,  MRN: [id number]  Chief Complaint  Patient presents with   Routine Post Op    POV #2 DOS 04/19/2022 REPAIR OF NON-UNION LT FOOT, REMOVAL OF HARDWARE, BONE GRAFT FROM HEEL. 2 weeks (around 05/10/2022) for cast change , suture removal.     61 y.o. female returns for post-op check.  Overall doing okay she has been staying off the foot and elevating and icing is much as possible  Review of Systems: Negative except as noted in the HPI. Denies N/V/F/Ch.   Objective:  There were no vitals filed for this visit. There is no height or weight on file to calculate BMI. Constitutional Well developed. Well nourished.  Vascular Foot warm and well perfused. Capillary refill normal to all digits.  Calf is soft and supple, no posterior calf or knee pain, negative Homans' sign  Neurologic Normal speech. Oriented to person, place, and time. Epicritic sensation to light touch grossly present bilaterally.  Dermatologic Skin completely epithelialized.  No signs of Deis is noted.  No redness noted noted.  Good correction alignment noted.  Orthopedic: Tenderness to palpation noted about the surgical site.  Moderate edema.  Splint is clean dry and intact   Multiple view plain film radiographs: Hardware intact in position and equivalent to immediate postop films Assessment:   1. Post-operative state   2. Pseudarthrosis after fusion or arthrodesis   3. Hallux valgus with bunions, left    Plan:  Patient was evaluated and treated and all questions answered.  S/p foot surgery left -Progressing as expected post-operatively. -XR: Noted above -WB Status: NWB in short leg cast which was applied today -Sutures: Sutures removed.  No dehiscence noted.  Cast was reapplied with bone stimulator adapter.   No follow-ups on file.

## 2022-05-24 ENCOUNTER — Ambulatory Visit (INDEPENDENT_AMBULATORY_CARE_PROVIDER_SITE_OTHER): Payer: Managed Care, Other (non HMO) | Admitting: Podiatry

## 2022-05-24 DIAGNOSIS — M2012 Hallux valgus (acquired), left foot: Secondary | ICD-10-CM | POA: Diagnosis not present

## 2022-05-24 DIAGNOSIS — M21612 Bunion of left foot: Secondary | ICD-10-CM | POA: Diagnosis not present

## 2022-05-24 DIAGNOSIS — M960 Pseudarthrosis after fusion or arthrodesis: Secondary | ICD-10-CM | POA: Diagnosis not present

## 2022-05-24 DIAGNOSIS — Z9889 Other specified postprocedural states: Secondary | ICD-10-CM | POA: Diagnosis not present

## 2022-05-28 NOTE — Progress Notes (Signed)
She presents today for a cast change, the cast adapter was not functioning on her previous cast.  Her cast was removed and a new well-padded below the knee fiberglass cast was applied with appropriate positioning and function of the cast adapter for the bone stimulator noted.  She will begin her bone stimulation at home.  I will see her back as scheduled for new radiographs

## 2022-05-31 ENCOUNTER — Encounter: Payer: Managed Care, Other (non HMO) | Admitting: Podiatry

## 2022-06-07 ENCOUNTER — Other Ambulatory Visit: Payer: Self-pay | Admitting: Podiatry

## 2022-06-19 ENCOUNTER — Ambulatory Visit: Payer: Managed Care, Other (non HMO)

## 2022-06-19 ENCOUNTER — Ambulatory Visit (INDEPENDENT_AMBULATORY_CARE_PROVIDER_SITE_OTHER): Payer: Managed Care, Other (non HMO) | Admitting: Podiatry

## 2022-06-19 DIAGNOSIS — M960 Pseudarthrosis after fusion or arthrodesis: Secondary | ICD-10-CM

## 2022-06-19 NOTE — Progress Notes (Signed)
  Subjective:  Patient ID: Brenda Good, female    DOB: [birth year],  MRN: [id number]  Chief Complaint  Patient presents with   Routine Post Op    POV #4 DOS 04/19/2022 REPAIR OF NON-UNION LT FOOT, REMOVAL OF HARDWARE, BONE GRAFT FROM HEEL.      60 y.o. female returns for post-op check.  She is very well she is not having any pain she is ready get out of the cast  Review of Systems: Negative except as noted in the HPI. Denies N/V/F/Ch.   Objective:  There were no vitals filed for this visit. There is no height or weight on file to calculate BMI. Constitutional Well developed. Well nourished.  Vascular Foot warm and well perfused. Capillary refill normal to all digits.  Calf is soft and supple, no posterior calf or knee pain, negative Homans' sign  Neurologic Normal speech. Oriented to person, place, and time. Epicritic sensation to light touch grossly present bilaterally.  Dermatologic Well-healed nonhypertrophic  Orthopedic: Minimal edema today.  No tenderness.  Cast is clean dry and intact   Multiple view plain film radiographs: Good integration of graft, there is metatarsus elevatus Assessment:   1. Pseudarthrosis after fusion or arthrodesis    Plan:  Patient was evaluated and treated and all questions answered.  S/p foot surgery left -Overall doing very well.  Appears to be well integrated and all hardware is stable.  I have transitioned her back to a cam walker boot and she may begin weightbearing as tolerated in this gradually.  I will see her back in 4 weeks for new x-rays we will plan to transition back to regular shoe gear at that point and plan for revision nail removal of the right toes.  New boot was dispensed today as her previous boot is no longer functioning correctly.  Unfortunate her x-rays are not in the ideal position there is some elevatus and recurrence of the hallux valgus but overall still improved from her preoperative films   Return in about 4 weeks  (around 07/17/2022) for post op (new x-rays).

## 2022-06-20 ENCOUNTER — Encounter: Payer: Self-pay | Admitting: Podiatry

## 2022-06-21 ENCOUNTER — Ambulatory Visit: Payer: Managed Care, Other (non HMO) | Admitting: Podiatry

## 2022-06-21 ENCOUNTER — Ambulatory Visit (INDEPENDENT_AMBULATORY_CARE_PROVIDER_SITE_OTHER): Payer: Managed Care, Other (non HMO)

## 2022-06-21 DIAGNOSIS — Z9889 Other specified postprocedural states: Secondary | ICD-10-CM

## 2022-06-21 NOTE — Progress Notes (Signed)
Patient seen in today to obtain xrays of left foot. Patient complained of pain to the left heel. Xray's obtained and reviewed by provider. Provider informed patient to continue using cam boot and scooter and to slowly increase the amount of walking everyday. Patient understood and agreed. Patient will follow up with provider at scheduled appointments.

## 2022-07-17 ENCOUNTER — Ambulatory Visit (INDEPENDENT_AMBULATORY_CARE_PROVIDER_SITE_OTHER): Payer: Managed Care, Other (non HMO) | Admitting: Podiatry

## 2022-07-17 DIAGNOSIS — Z9889 Other specified postprocedural states: Secondary | ICD-10-CM

## 2022-07-17 NOTE — Progress Notes (Signed)
  Subjective:  Patient ID: Brenda Good, female    DOB: 07-Feb-1961,  MRN: [id number]  Chief Complaint  Patient presents with   Routine Post Op    POV #5 DOS 04/19/2022 REPAIR OF NON-UNION LT FOOT, REMOVAL OF HARDWARE, BONE GRAFT FROM HEEL. Pain is present but it is a lot better than what it was.      61 y.o. female returns for post-op check.  She is doing much better not having any more heel pain, back in regular shoes  Review of Systems: Negative except as noted in the HPI. Denies N/V/F/Ch.   Objective:  There were no vitals filed for this visit. There is no height or weight on file to calculate BMI. Constitutional Well developed. Well nourished.  Vascular Foot warm and well perfused. Capillary refill normal to all digits.  Calf is soft and supple, no posterior calf or knee pain, negative Homans' sign  Neurologic Normal speech. Oriented to person, place, and time. Epicritic sensation to light touch grossly present bilaterally.  Dermatologic Well-healed nonhypertrophic  Orthopedic: She has no edema today.  No tenderness.     Multiple view plain film radiographs: Good integration of graft, there is metatarsus elevatus Assessment:   1. Post-operative state    Plan:  Patient was evaluated and treated and all questions answered.  S/p foot surgery left -Doing much better.  She may continue regular activity and regular shoe gear at this point.  Regarding her recurrent dystrophic nails she would like to have these removed permanently still.  We discussed the risks and benefits of this including the possibility of further recurrence.  She would like to have this done in November and surgery will be scheduled for an in office total permanent matricectomy's of all the toenails, starting with the right foot first and the left foot   No follow-ups on file.

## 2022-08-08 ENCOUNTER — Telehealth: Payer: Self-pay | Admitting: Urology

## 2022-08-08 NOTE — Telephone Encounter (Signed)
OFFICE DOS - 09/10/22  EXC. NAIL PERM X'S 5 --- 84166   [employer] EFFECTIVE DATE 11/05/21  PER [employer]'S AUTOMATIVE SYSTEM FOR CPT CODE 06301 NO PRIOR AUTH IS REQUIRED.  REF # [id number]

## 2022-08-31 ENCOUNTER — Telehealth: Payer: Self-pay | Admitting: Podiatry

## 2022-08-31 NOTE — Telephone Encounter (Signed)
DOS: 10/02/2022  [employer]  Procedure: Exc. Nail Permanent all toes Lt (11750)  DX: L60.0  Deductible: $5,000 with $0 remaining Out-of-Pocket: $8,700 with $0 remaining. CoInsurance: 0% Copay: $25  Prior Authorization is Not required per [employer].  Reference Number: [id number]

## 2022-09-10 ENCOUNTER — Ambulatory Visit (INDEPENDENT_AMBULATORY_CARE_PROVIDER_SITE_OTHER): Payer: Managed Care, Other (non HMO) | Admitting: Podiatry

## 2022-09-10 DIAGNOSIS — L603 Nail dystrophy: Secondary | ICD-10-CM

## 2022-09-10 MED ORDER — NEOMYCIN-POLYMYXIN-HC 3.5-10000-1 OT SUSP: OTIC | 0 refills | Status: DC

## 2022-09-10 NOTE — Patient Instructions (Signed)

## 2022-09-10 NOTE — Progress Notes (Signed)
Brenda Good presents today for follow-up and definitive treatment of her recurrent dystrophic painful toenails that she would like permanently removed which we have previously discussed.  Following verbal consent and field block with 6 cc each of 2% lidocaine and 0.5% Marcaine plain and sterile prep with Betadine a tourniquet was applied around the right ankle and inflated to 225 mmHg after exsanguination.  All 5 nail plates were totally removed with a Soil scientist.  3 applications of sodium hydroxide 10% was applied with cotton applicators for 30 seconds each.  Following application of of this it was irrigated thoroughly with alcohol.  Each toe was dressed with Silvadene nonadherent gauze and compression wrap.  Post care instructions were given and we advised on the signs and symptoms of infection.  Cortisporin drops sent to pharmacy to begin using tomorrow.  She will follow-up with me in 1 month to treat the left foot.  She tolerated the procedure well.

## 2022-10-02 ENCOUNTER — Ambulatory Visit (INDEPENDENT_AMBULATORY_CARE_PROVIDER_SITE_OTHER): Payer: Managed Care, Other (non HMO) | Admitting: Podiatry

## 2022-10-02 DIAGNOSIS — L603 Nail dystrophy: Secondary | ICD-10-CM | POA: Diagnosis not present

## 2022-10-02 MED ORDER — NEOMYCIN-POLYMYXIN-HC 3.5-10000-1 OT SUSP: OTIC | 0 refills | Status: AC

## 2022-10-02 MED ORDER — MUPIROCIN 2 % EX OINT: 1.0000 | TOPICAL_OINTMENT | Freq: Two times a day (BID) | CUTANEOUS | 2 refills | Status: DC

## 2022-10-02 MED ORDER — CEPHALEXIN 500 MG PO CAPS: 500.0000 mg | ORAL_CAPSULE | Freq: Three times a day (TID) | ORAL | 0 refills | Status: AC

## 2022-10-02 NOTE — Progress Notes (Signed)
Brenda Good presents today for follow-up and definitive treatment of her recurrent dystrophic painful toenails that she would like permanently removed which we have previously discussed.  Following verbal consent and field block with 3 cc each of 2% lidocaine and 0.5% Marcaine plain and sterile prep with Betadine a tourniquet was applied around the left ankle and inflated to 225 mmHg after exsanguination. The left 1st, 2nd and 3rd nail plates were totally removed with a Therapist, nutritional.  2 applications of sodium hydroxide 10% was applied with cotton applicators for 30 seconds each.  Following application of of this it was irrigated thoroughly with alcohol.  Each toe was dressed with Silvadene nonadherent gauze and compression wrap.  Post care instructions were given and we advised on the signs and symptoms of infection.  Cortisporin drops sent to pharmacy to begin using tomorrow.  She will follow-up with me in 1 month to treat the left foot.  She tolerated the procedure well.  I also evaluated her right foot today. Some delayed healing present, I prescribed her Keflex as a precaution and mupirocin ointment to use on the 1/2/3 toes. 4/5 are healing well. She should continue soaks until she sees me next visit.

## 2022-10-25 ENCOUNTER — Ambulatory Visit: Payer: Managed Care, Other (non HMO) | Admitting: Podiatry

## 2022-10-25 DIAGNOSIS — L603 Nail dystrophy: Secondary | ICD-10-CM

## 2022-10-25 MED ORDER — MUPIROCIN 2 % EX OINT: 1.0000 | TOPICAL_OINTMENT | Freq: Two times a day (BID) | CUTANEOUS | 2 refills | Status: AC

## 2022-10-25 NOTE — Progress Notes (Signed)
  Subjective:  Patient ID: Brenda Good, female    DOB: 05/29/1961,  MRN: [id number]  Chief Complaint  Patient presents with   Routine Post Op    1 MONTH FOLLOW UP REMOVAL (PERMANENT) OF ALL TOENAILS RT & LT FOOT    61 y.o. female presents with the above complaint. History confirmed with patient.  Overall they are doing okay she is out of the mupirocin they are taking some time to heal  Objective:  Physical Exam: warm, good capillary refill, no trophic changes or ulcerative lesions, normal DP and PT pulses, normal sensory exam, and still delayed healing of most of the avulsion matricectomy sites, some scab present no signs of infection  Assessment:   1. 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  Continue soaking with gentle debridement with a brush.  Continue using mupirocin ointment.  Refill sent.  Return in 1 month for follow-up  Return in about 1 month (around 11/25/2022) for nail re-check.

## 2022-11-27 ENCOUNTER — Ambulatory Visit: Payer: Managed Care, Other (non HMO) | Admitting: Podiatry

## 2022-11-27 DIAGNOSIS — L603 Nail dystrophy: Secondary | ICD-10-CM

## 2022-11-27 NOTE — Progress Notes (Signed)
  Subjective:  Patient ID: Brenda Good, female    DOB: 09/06/1961,  MRN: [id number]  Chief Complaint  Patient presents with   Nail Problem    Follow up after nail removal    62 y.o. female presents with the above complaint. History confirmed with patient.  She is doing much better and she is pleased with progress  Objective:  Physical Exam: warm, good capillary refill, no trophic changes or ulcerative lesions, normal DP and PT pulses, normal sensory exam, and all avulsion sites are nearly fully healed with the exception of the great toe which still has some granular wound bed  Assessment:   1. 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  Doing much better.  She will continue using mupirocin ointment in the great toes.  May leave open to air at this point.  She will return to see me as needed if these or other issues develop or worsen Return if symptoms worsen or fail to improve.

## 2022-12-17 ENCOUNTER — Encounter: Payer: Self-pay | Admitting: Podiatry

## 2023-01-02 ENCOUNTER — Ambulatory Visit: Payer: Managed Care, Other (non HMO) | Admitting: Podiatry

## 2023-01-02 DIAGNOSIS — M216X2 Other acquired deformities of left foot: Secondary | ICD-10-CM

## 2023-01-02 DIAGNOSIS — M2142 Flat foot [pes planus] (acquired), left foot: Secondary | ICD-10-CM

## 2023-01-02 DIAGNOSIS — M7741 Metatarsalgia, right foot: Secondary | ICD-10-CM

## 2023-01-02 DIAGNOSIS — M2170 Unequal limb length (acquired), unspecified site: Secondary | ICD-10-CM

## 2023-01-02 DIAGNOSIS — M2141 Flat foot [pes planus] (acquired), right foot: Secondary | ICD-10-CM

## 2023-01-02 DIAGNOSIS — M21961 Unspecified acquired deformity of right lower leg: Secondary | ICD-10-CM

## 2023-01-02 DIAGNOSIS — M7742 Metatarsalgia, left foot: Secondary | ICD-10-CM

## 2023-01-03 NOTE — Progress Notes (Signed)
  Subjective:  Patient ID: Brenda Good, female    DOB: June 21, 1961,  MRN: [id number]  Chief Complaint  Patient presents with   Foot Pain    bilateral foot pain    62 y.o. female presents with the above complaint. History confirmed with patient.  She presents today for follow-up.  She has had increasing right foot pain and bilateral hip and knee pain, her primary care doctor discussed with her she likely has a limb length discrepancy.  Toenails are healing well  Objective:  Physical Exam: warm, good capillary refill, no trophic changes or ulcerative lesions, normal DP and PT pulses, normal sensory exam, and prominent metatarsal heads plantar that are slightly tender, she has pes planus deformity, she has unequal limb length with approximately 10 to 12 mm shorter on the left side which appears to be in the femoral segment.  Matricectomy sites of toenails are healing well  Assessment:   1. Acquired unequal limb length   2. Metatarsal deformity, right   3. Pes planus of both feet   4. Metatarsalgia of both feet      Plan:  Patient was evaluated and treated and all questions answered.  We discussed treatment of her unequal limb length and metatarsalgia.  I recommended a custom molded foot orthosis, with a 10 mm left on the left side.  I discussed with her that much more than this amount likely needs to be added as a external lift to the shoe.  Felt heel lifts for temporary relief were dispensed today.  Custom molded impressions were taken and will be sent for creation of her inserts.  These also have metatarsal pads to offload the metatarsal area.  Will let her know when they are ready for any possible adjustments and pick up  No follow-ups on file.

## 2023-03-05 ENCOUNTER — Telehealth: Payer: Self-pay | Admitting: Podiatry

## 2023-03-05 NOTE — Telephone Encounter (Signed)
Lmom to call back to schedule picking up orthotics  

## 2023-03-12 ENCOUNTER — Ambulatory Visit (INDEPENDENT_AMBULATORY_CARE_PROVIDER_SITE_OTHER): Payer: Managed Care, Other (non HMO)

## 2023-03-12 DIAGNOSIS — M2170 Unequal limb length (acquired), unspecified site: Secondary | ICD-10-CM

## 2023-03-12 NOTE — Progress Notes (Signed)
Patient presents today to pick up custom molded foot orthotics recommended by Dr. MCDONALD.   Orthotics were dispensed and fit was satisfactory. Reviewed instructions for break-in and wear. Written instructions given to patient.  Patient will follow up as needed.   

## 2023-04-29 ENCOUNTER — Ambulatory Visit: Payer: Managed Care, Other (non HMO)

## 2023-04-29 ENCOUNTER — Ambulatory Visit: Payer: Managed Care, Other (non HMO) | Admitting: Podiatry

## 2023-04-29 DIAGNOSIS — R600 Localized edema: Secondary | ICD-10-CM | POA: Diagnosis not present

## 2023-04-29 DIAGNOSIS — M79671 Pain in right foot: Secondary | ICD-10-CM | POA: Diagnosis not present

## 2023-04-29 DIAGNOSIS — M7671 Peroneal tendinitis, right leg: Secondary | ICD-10-CM | POA: Diagnosis not present

## 2023-04-29 NOTE — Progress Notes (Unsigned)
Chief Complaint  Patient presents with   Foot Pain    Right foot pain patient states pain varies and is not constant     HPI: 62 y.o. female presenting today with her husband with concern of pain along the outside of the right midfoot.  Denies injury or bruising.  She notes that she has been on uneven surfaces lately due to getting a new pool installed at home.  She notes that it is not getting better on its own.  Denies bruising hurts more with walking  No past medical history on file.  No past surgical history on file.  Allergies  Allergen Reactions   Dexamethasone     Other reaction(s): Other   Ibuprofen Hives and Rash   Clarithromycin     Other reaction(s): Mental Status Changes (intolerance) hallucinations   Codeine     Other reaction(s): Mental Status Changes (intolerance) hallucinations    Moxifloxacin Swelling    Tolerates cipro      Physical Exam:  General: The patient is alert and oriented x3 in no acute distress.  Dermatology: Skin is warm, dry and supple bilateral lower extremities. Interspaces are clear of maceration and debris.    Vascular: Palpable pedal pulses bilaterally. Capillary refill within normal limits.  No erythema or calor.  Neurological: Light touch sensation grossly intact bilateral feet.   Musculoskeletal Exam: Pain on palpation of the distal peroneus brevis tendon near and at its insertion into the fifth metatarsal base.  No ecchymosis is noted.  Minimal localized edema is noted.  No pain on palpation of the distal shaft or fifth metatarsal phalangeal joint.  Some pain with resisted eversion of the foot.  No palpable gaps or nodules within the peroneal tendons.   Radiographic Exam (right foot, 3 weightbearing views, 04/29/2023):  Normal osseous mineralization.  There is hardware in the first metatarsal-medial cuneiform joint from a previous arthrodesis.  There is a screw in the first proximal phalanx from prior Aiken osteotomy.  There are  2 small screws located in the head of the second metatarsal.  There is no evidence of fracture or periosteal reaction to the fifth metatarsal base area.  Assessment/Plan of Care: 1. Peroneal tendonitis, right   2. Right foot pain   3. Localized edema     DG FOOT COMPLETE RIGHT  Discussed clinical findings with patient today.  We discussed how uneven surfaces can put strain on the peroneal tendons.  With the patient's verbal consent after discussing conservative treatment options, a corticosteroid injection was administered to the right insertion of the peroneus brevis tendon at the fifth metatarsal base.  This consisted of a mixture of 1% lidocaine plain, 0.5% Marcaine plain, and Kenalog 10 for total of 1.5 cc administered.  She tolerated this well.  A Band-Aid was applied.  She will remove this at that time.  She was also instructed to avoid any high impact activities for now and try to avoid uneven surfaces if possible.  Follow-up in 3 to 4 weeks if still symptomatic   Hadja Harral D. Zynasia Burklow, DPM, FACFAS [facility]     [address], [place] [address]                Office [phone]  Fax [phone]

## 2023-04-30 DIAGNOSIS — M7671 Peroneal tendinitis, right leg: Secondary | ICD-10-CM | POA: Diagnosis not present

## 2023-04-30 MED ORDER — TRIAMCINOLONE ACETONIDE 10 MG/ML IJ SUSP
10.0000 mg | Freq: Once | INTRAMUSCULAR | Status: AC
  Administered 2023-04-30: 10 mg

## 2023-08-15 ENCOUNTER — Other Ambulatory Visit: Payer: Self-pay | Admitting: Medical Genetics

## 2023-08-15 DIAGNOSIS — Z006 Encounter for examination for normal comparison and control in clinical research program: Secondary | ICD-10-CM

## 2023-12-08 IMAGING — CT CT FOOT*L* W/O CM
2 series · 10 of 27 positions shown, 13 images · non-contrast
Comparison: None.

CLINICAL DATA: Arthrodesis of the right foot. Patient presents for
re-evaluation.

EXAM:
CT OF THE LEFT FOOT WITHOUT CONTRAST
TECHNIQUE: Multidetector CT imaging of the left foot was performed according to
the standard protocol. Multiplanar CT image reconstructions were
also generated.
RADIATION DOSE REDUCTION: This exam was performed according to the
departmental dose-optimization program which includes automated
exposure control, adjustment of the mA and/or kV according to
patient size and/or use of iterative reconstruction technique.

[Series 4: soft tissue lower extremity (person_name) · axial · 0.71mm/px · z∈[-54,+48]mm · 5 of 75 slices shown, 7 images]
[im 12/75  soft-tissue]
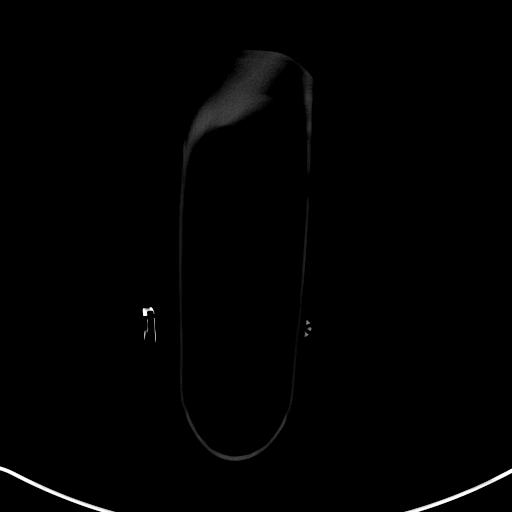
[im 12/75  bone]
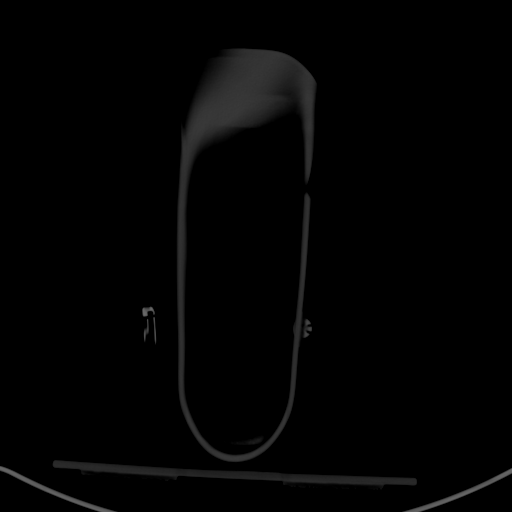
[im 23/75  bone]
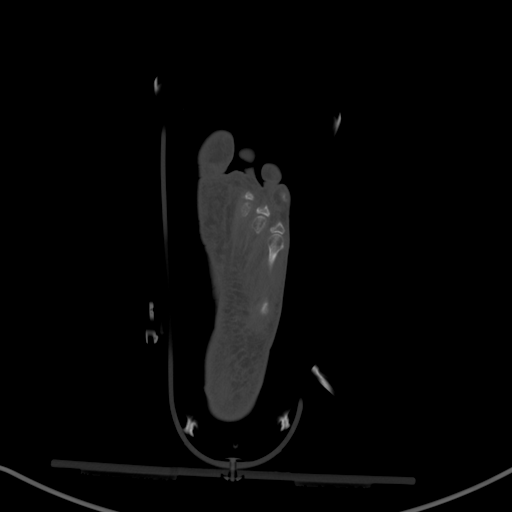
[im 40/75  bone]
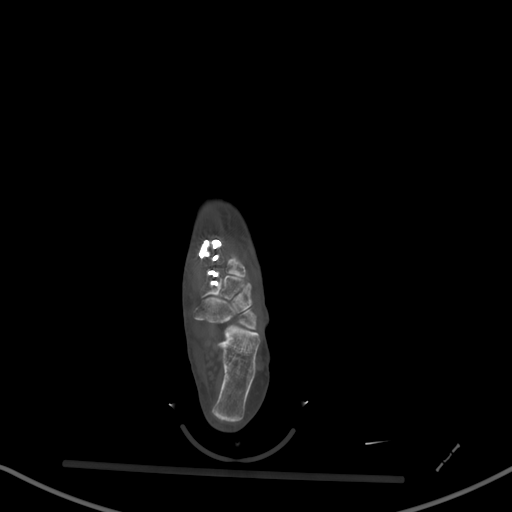
[im 52/75  bone]
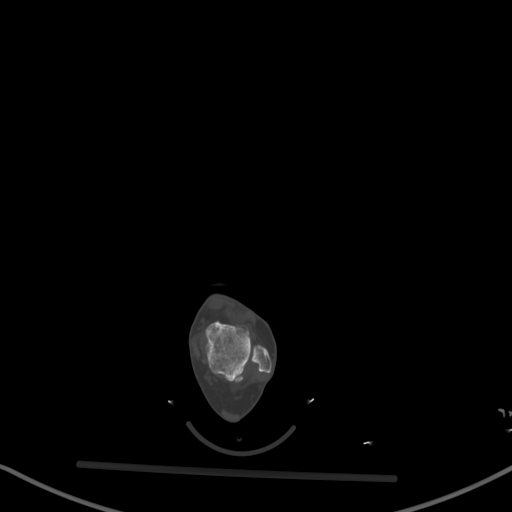
[im 63/75  soft-tissue]
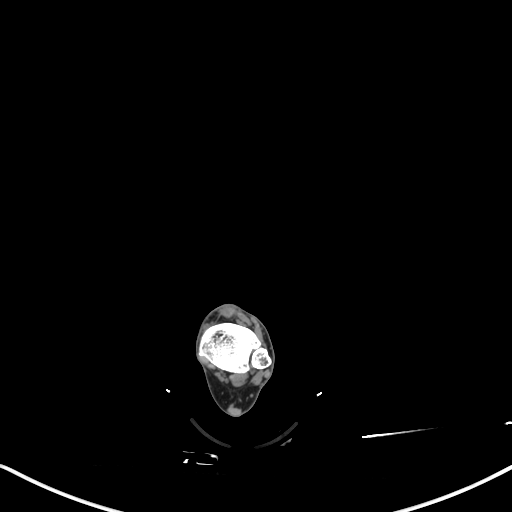
[im 63/75  bone]
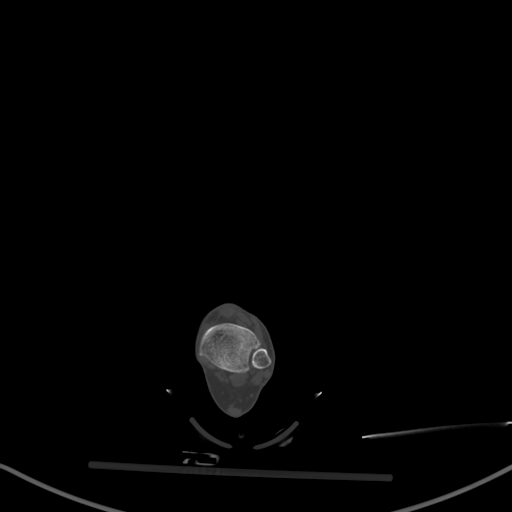

[Series 10: sagsoft tissue (person_name) · sagittal · 0.24mm/px · 5 of 55 slices shown, 6 images]
[im 19/55  bone]
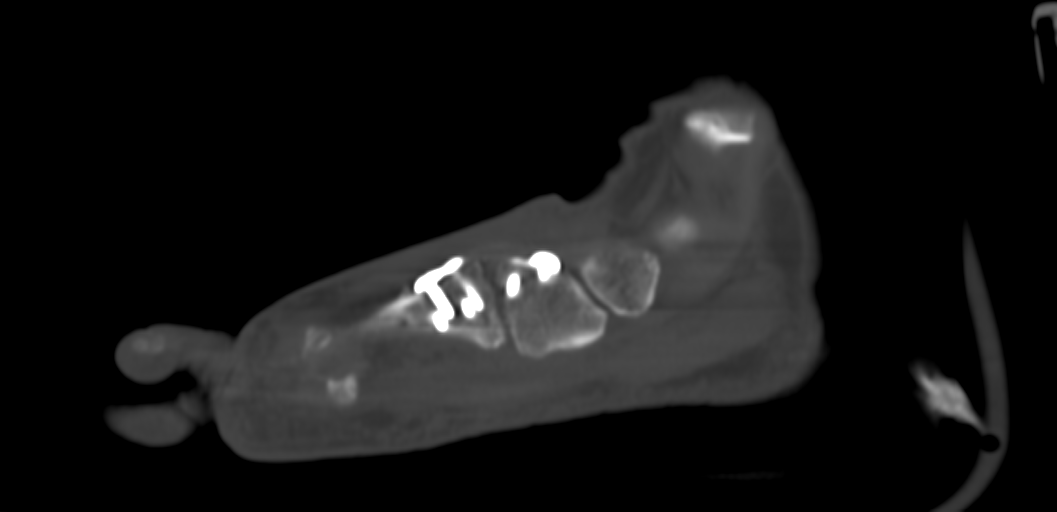
[im 23/55  bone]
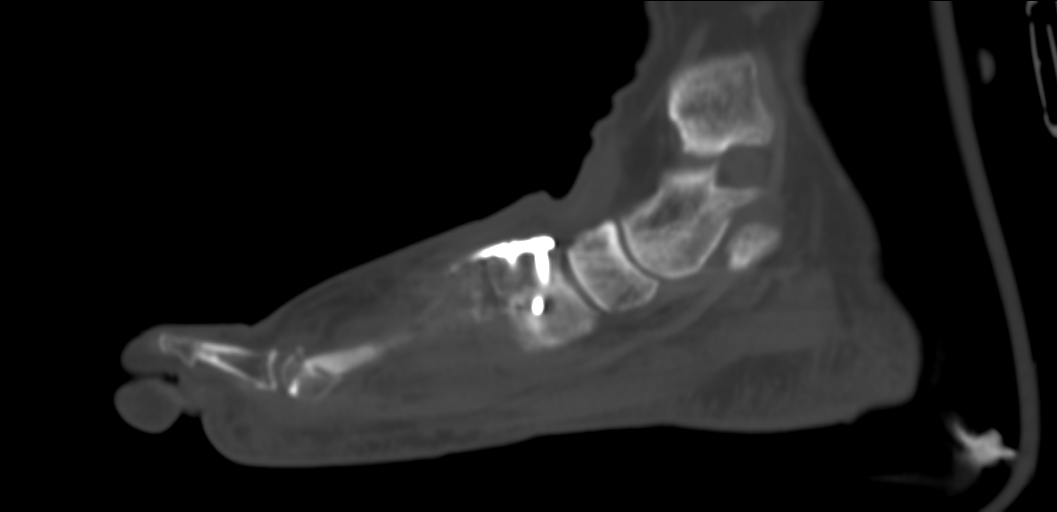
[im 28/55  soft-tissue]
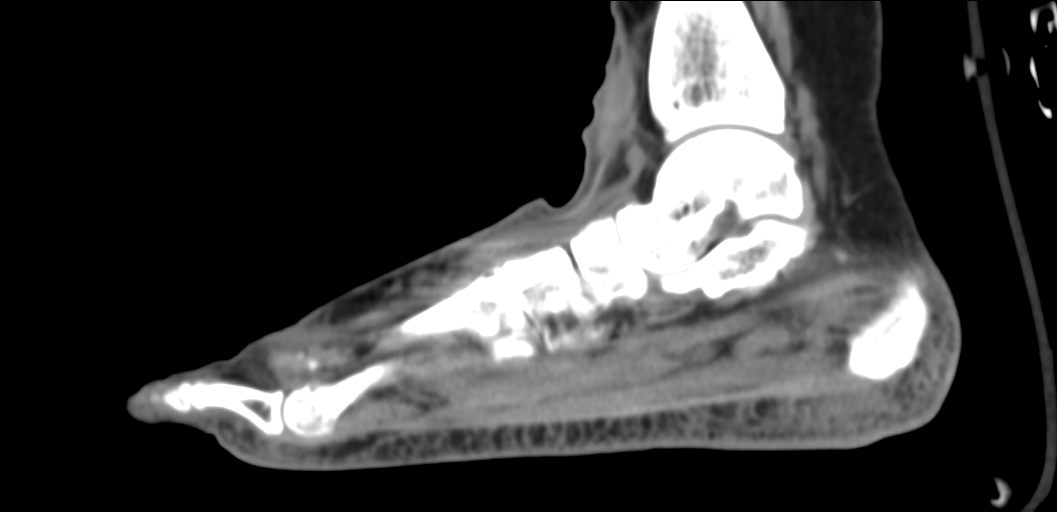
[im 28/55  bone]
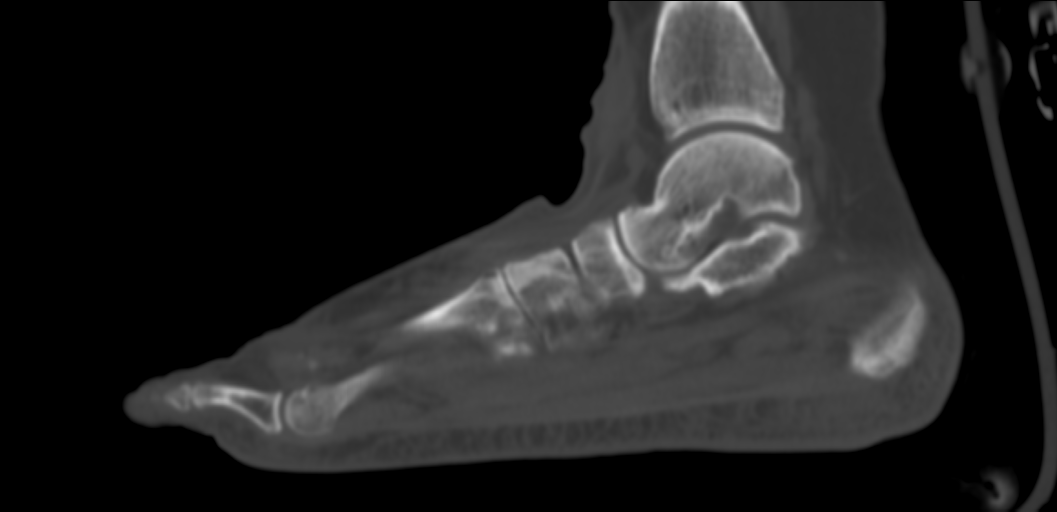
[im 32/55  bone]
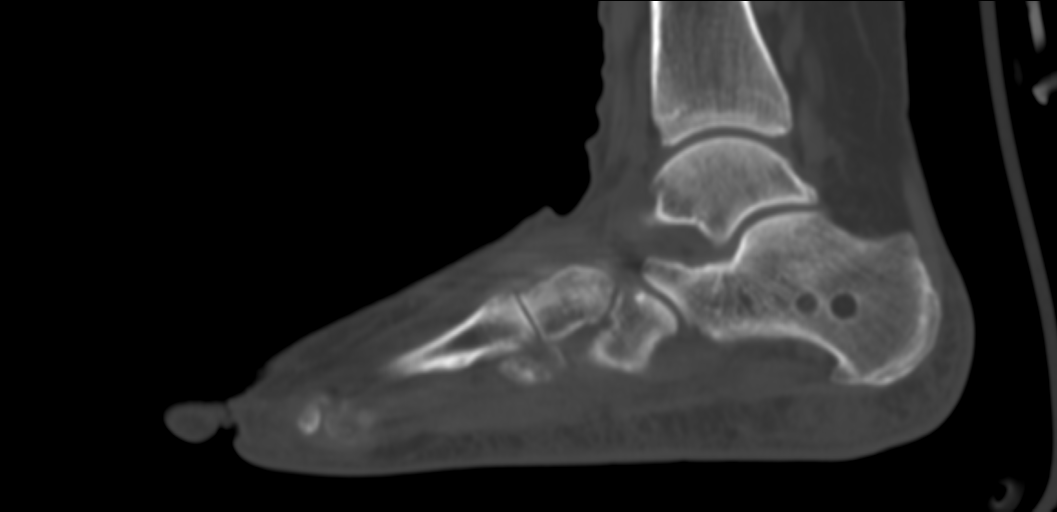
[im 37/55  bone]
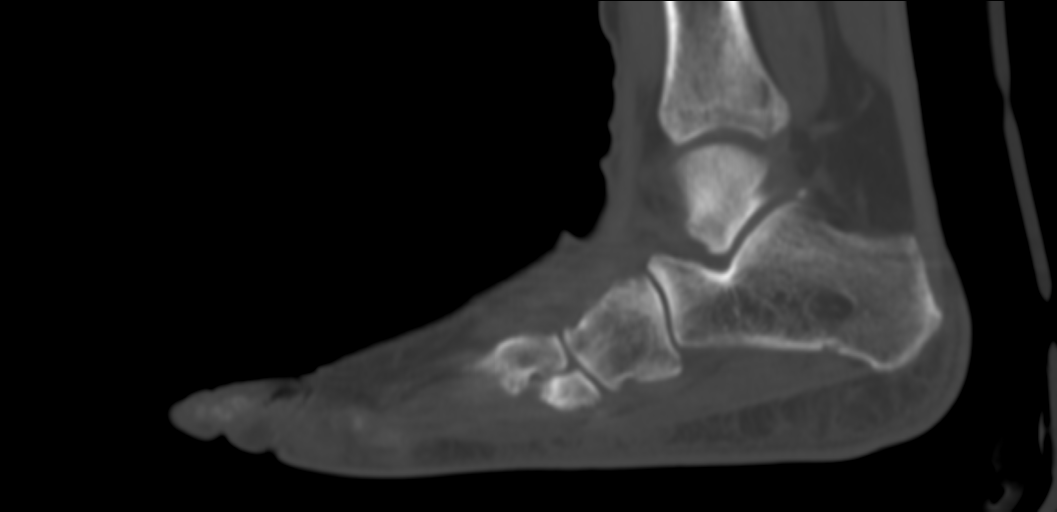

[10 of 27 positions shown; findings below may reference images not displayed]

FINDINGS: Bones/Joint/Cartilage

Prior arthrodesis of the first TMT joint with a dorsal and medial
sideplate and interlocking screws. No significant bone bridging
across the first TMT joint.

Generalized osteopenia.

Healed first proximal phalangeal osteotomy transfixed with a single
screw. Single screw in the second metatarsal head. Mild
osteoarthritis of the second TMT joint. Postsurgical changes in the
posterior calcaneus. No acute fracture or dislocation. Normal
alignment. No joint effusion.

Ligaments

Ligaments are suboptimally evaluated by CT.

Muscles and Tendons
Muscles are normal. No muscle atrophy. No intramuscular fluid
collection or hematoma. Flexor, peroneal and Achilles tendons are
intact. Moderate tendinosis of the tibialis anterior tendon.
Remainder the extensor compartment tendons are intact.

Soft tissue
No fluid collection or hematoma. No soft tissue mass.
IMPRESSION: 1. Prior arthrodesis of the first TMT joint with a dorsal and medial
sideplate and interlocking screws. No significant bone bridging
across the first TMT joint.
2. Healed first proximal phalangeal osteotomy transfixed with a
single screw.
3. Mild osteoarthritis of the second TMT joint.
4. Moderate tendinosis of the tibialis anterior tendon.

## 2024-09-01 ENCOUNTER — Other Ambulatory Visit: Payer: Self-pay | Admitting: Medical Genetics

## 2024-09-01 DIAGNOSIS — Z006 Encounter for examination for normal comparison and control in clinical research program: Secondary | ICD-10-CM

## 2024-09-08 ENCOUNTER — Other Ambulatory Visit: Payer: Self-pay | Admitting: Medical Genetics

## 2024-09-08 DIAGNOSIS — Z006 Encounter for examination for normal comparison and control in clinical research program: Secondary | ICD-10-CM

## 2024-09-08 NOTE — Addendum Note (Signed)
 Addended by: REMUS NOEMI PARAS on: 09/08/2024 12:13 PM   Modules accepted: Orders

## 2024-10-06 LAB — GENECONNECT MOLECULAR SCREEN: Genetic Analysis Overall Interpretation: NEGATIVE
